# Patient Record
Sex: Female | Born: 1975 | Hispanic: Yes | Smoking: Never smoker
Health system: Southern US, Community
[De-identification: ages and names within clinical notes are randomized; demographics above are authoritative.]

## PROBLEM LIST (undated history)

## (undated) DIAGNOSIS — E785 Hyperlipidemia, unspecified: Secondary | ICD-10-CM

## (undated) DIAGNOSIS — D649 Anemia, unspecified: Secondary | ICD-10-CM

## (undated) DIAGNOSIS — R7303 Prediabetes: Secondary | ICD-10-CM

## (undated) HISTORY — DX: Hyperlipidemia, unspecified: E78.5

## (undated) HISTORY — DX: Prediabetes: R73.03

---

## 2001-10-26 HISTORY — PX: TUBAL LIGATION: SHX77

## 2015-03-07 ENCOUNTER — Ambulatory Visit (INDEPENDENT_AMBULATORY_CARE_PROVIDER_SITE_OTHER): Payer: Self-pay | Admitting: Family Medicine

## 2015-03-07 VITALS — BP 102/68 | HR 109 | Temp 99.5°F | Resp 18 | Ht 63.5 in | Wt 182.0 lb

## 2015-03-07 DIAGNOSIS — R112 Nausea with vomiting, unspecified: Secondary | ICD-10-CM

## 2015-03-07 DIAGNOSIS — R197 Diarrhea, unspecified: Secondary | ICD-10-CM

## 2015-03-07 MED ORDER — ONDANSETRON 4 MG PO TBDP: 4.0000 mg | ORAL_TABLET | Freq: Three times a day (TID) | ORAL | Status: DC | PRN

## 2015-03-07 NOTE — Patient Instructions (Signed)
Continue to drink plenty of fluids to stay hydrated. Pepto bismol or Immodium if needed.  If fever persists into tomorrow, or diarrhea is not improving tomorrow - return so we can check stool tests and possible start antibiotic. You do not need to check in for these tests (unless your symptoms are worsening).  Let them know at check in that I am expecting you and you are here for labs only. The team leader can advise me if you return tomorrow.  Zofran if needed for nausea.  Return to the clinic or go to the nearest emergency room if any of your symptoms worsen or new symptoms occur.   Gastroenteritis:  Diarrhea Infections caused by germs (bacterial) or a virus commonly cause diarrhea. Your caregiver has determined that with time, rest and fluids, the diarrhea should improve. In general, eat normally while drinking more water than usual. Although water may prevent dehydration, it does not contain salt and minerals (electrolytes). Broths, weak tea without caffeine and oral rehydration solutions (ORS) replace fluids and electrolytes. Small amounts of fluids should be taken frequently. Large amounts at one time may not be tolerated. Plain water may be harmful in infants and the elderly. Oral rehydrating solutions (ORS) are available at pharmacies and grocery stores. ORS replace water and important electrolytes in proper proportions. Sports drinks are not as effective as ORS and may be harmful due to sugars worsening diarrhea.  ORS is especially recommended for use in children with diarrhea. As a general guideline for children, replace any new fluid losses from diarrhea and/or vomiting with ORS as follows:   If your child weighs 22 pounds or under (10 kg or less), give 60-120 mL ( -  cup or 2 - 4 ounces) of ORS for each episode of diarrheal stool or vomiting episode.   If your child weighs more than 22 pounds (more than 10 kgs), give 120-240 mL ( - 1 cup or 4 - 8 ounces) of ORS for each diarrheal stool  or episode of vomiting.   While correcting for dehydration, children should eat normally. However, foods high in sugar should be avoided because this may worsen diarrhea. Large amounts of carbonated soft drinks, juice, gelatin desserts and other highly sugared drinks should be avoided.   After correction of dehydration, other liquids that are appealing to the child may be added. Children should drink small amounts of fluids frequently and fluids should be increased as tolerated. Children should drink enough fluids to keep urine clear or pale yellow.   Adults should eat normally while drinking more fluids than usual. Drink small amounts of fluids frequently and increase as tolerated. Drink enough fluids to keep urine clear or pale yellow. Broths, weak decaffeinated tea, lemon lime soft drinks (allowed to go flat) and ORS replace fluids and electrolytes.   Avoid:   Carbonated drinks.   Juice.   Extremely hot or cold fluids.   Caffeine drinks.   Fatty, greasy foods.   Alcohol.   Tobacco.   Too much intake of anything at one time.   Gelatin desserts.   Probiotics are active cultures of beneficial bacteria. They may lessen the amount and number of diarrheal stools in adults. Probiotics can be found in yogurt with active cultures and in supplements.   Wash hands well to avoid spreading bacteria and virus.   Anti-diarrheal medications are not recommended for infants and children.   Only take over-the-counter or prescription medicines for pain, discomfort or fever as directed by your caregiver. Do  not give aspirin to children because it may cause Reye's Syndrome.   For adults, ask your caregiver if you should continue all prescribed and over-the-counter medicines.   If your caregiver has given you a follow-up appointment, it is very important to keep that appointment. Not keeping the appointment could result in a chronic or permanent injury, and disability. If there is any problem  keeping the appointment, you must call back to this facility for assistance.  SEEK IMMEDIATE MEDICAL CARE IF:   You or your child is unable to keep fluids down or other symptoms or problems become worse in spite of treatment.   Vomiting or diarrhea develops and becomes persistent.   There is vomiting of blood or bile (green material).   There is blood in the stool or the stools are black and tarry.   There is no urine output in 6-8 hours or there is only a small amount of very dark urine.   Abdominal pain develops, increases or localizes.   You have a fever.   Your baby is older than 3 months with a rectal temperature of 102 F (38.9 C) or higher.   Your baby is 68 months old or younger with a rectal temperature of 100.4 F (38 C) or higher.   You or your child develops excessive weakness, dizziness, fainting or extreme thirst.   You or your child develops a rash, stiff neck, severe headache or become irritable or sleepy and difficult to awaken.  MAKE SURE YOU:   Understand these instructions.   Will watch your condition.   Will get help right away if you are not doing well or get worse.  Document Released: 10/02/2002 Document Revised: 10/01/2011 Document Reviewed: 08/19/2009 [facility] Patient Information 2012 [place].  Nausea and Vomiting Nausea is a sick feeling that often comes before throwing up (vomiting). Vomiting is a reflex where stomach contents come out of your mouth. Vomiting can cause severe loss of body fluids (dehydration). Children and elderly adults can become dehydrated quickly, especially if they also have diarrhea. Nausea and vomiting are symptoms of a condition or disease. It is important to find the cause of your symptoms. CAUSES   Direct irritation of the stomach lining. This irritation can result from increased acid production (gastroesophageal reflux disease), infection, food poisoning, taking certain medicines (such as nonsteroidal  anti-inflammatory drugs), alcohol use, or tobacco use.   Signals from the brain.These signals could be caused by a headache, heat exposure, an inner ear disturbance, increased pressure in the brain from injury, infection, a tumor, or a concussion, pain, emotional stimulus, or metabolic problems.   An obstruction in the gastrointestinal tract (bowel obstruction).   Illnesses such as diabetes, hepatitis, gallbladder problems, appendicitis, kidney problems, cancer, sepsis, atypical symptoms of a heart attack, or eating disorders.   Medical treatments such as chemotherapy and radiation.   Receiving medicine that makes you sleep (general anesthetic) during surgery.  DIAGNOSIS Your caregiver may ask for tests to be done if the problems do not improve after a few days. Tests may also be done if symptoms are severe or if the reason for the nausea and vomiting is not clear. Tests may include:  Urine tests.   Blood tests.   Stool tests.   Cultures (to look for evidence of infection).   X-rays or other imaging studies.  Test results can help your caregiver make decisions about treatment or the need for additional tests. TREATMENT You need to stay well hydrated. Drink frequently but  in small amounts.You may wish to drink water, sports drinks, clear broth, or eat frozen ice pops or gelatin dessert to help stay hydrated.When you eat, eating slowly may help prevent nausea.There are also some antinausea medicines that may help prevent nausea. HOME CARE INSTRUCTIONS   Take all medicine as directed by your caregiver.   If you do not have an appetite, do not force yourself to eat. However, you must continue to drink fluids.   If you have an appetite, eat a normal diet unless your caregiver tells you differently.   Eat a variety of complex carbohydrates (rice, wheat, potatoes, bread), lean meats, yogurt, fruits, and vegetables.   Avoid high-fat foods because they are more difficult to digest.    Drink enough water and fluids to keep your urine clear or pale yellow.   If you are dehydrated, ask your caregiver for specific rehydration instructions. Signs of dehydration may include:   Severe thirst.   Dry lips and mouth.   Dizziness.   Dark urine.   Decreasing urine frequency and amount.   Confusion.   Rapid breathing or pulse.  SEEK IMMEDIATE MEDICAL CARE IF:   You have blood or brown flecks (like coffee grounds) in your vomit.   You have black or bloody stools.   You have a severe headache or stiff neck.   You are confused.   You have severe abdominal pain.   You have chest pain or trouble breathing.   You do not urinate at least once every 8 hours.   You develop cold or clammy skin.   You continue to vomit for longer than 24 to 48 hours.   You have a fever.  MAKE SURE YOU:   Understand these instructions.   Will watch your condition.   Will get help right away if you are not doing well or get worse.  Document Released: 10/12/2005 Document Revised: 10/01/2011 Document Reviewed: 03/11/2011 [facility] Patient Information 2012 [place], [place].

## 2015-03-07 NOTE — Progress Notes (Signed)
Subjective:  This chart was scribed for Merri Ray, MD by [facility], medical scribe at [facility].The patient was seen in exam room 05 and the patient's care was started at 4:21 PM.   Patient ID: Katrina Richardson, female    DOB: [birth year], 39 y.o.   MRN: [id number] Chief Complaint  Patient presents with  . Diarrhea    x2 days   . Fever  . Headache  . Emesis   HPI  HPI Comments: Katrina Richardson is a 39 y.o. female who presents to [facility] complaining of diarrhea, fever, headache and emesis. Pt has one episode of emesis two days ago. Diarrhea began two days ago and she has had several episodes since. Her last episode was today in the office. She has a nausea, loss of appetite headache, hot and cold spells, dizziness and lightheadedness as associated symptoms. Pt is able to eat and drink, but eating upsets her stomach. She is urinating normally. Pt has taken Pepto Bismol, Advil and Kaopectate. Pt says she may have eaten either undercooked or spoiled bacon two days ago. She denies sick contacts, pt has not traveled outside the country and has not had a flu vaccine. She denies back pain and cough.  Language barrier, a translator was present.   There are no active problems to display for this patient.  History reviewed. No pertinent past medical history. Past Surgical History  Procedure Laterality Date  . Cesarean section     No Known Allergies Prior to Admission medications   Not on File   History   Social History  . Marital Status: Single    Spouse Name: N/A  . Number of Children: N/A  . Years of Education: N/A   Occupational History  . Not on file.   Social History Main Topics  . Smoking status: Never Smoker   . Smokeless tobacco: Not on file  . Alcohol Use: No  . Drug Use: No  . Sexual Activity: Not on file   Other Topics Concern  . Not on file   Social History Narrative  . No narrative on file    Review of Systems  Constitutional: Positive for chills, diaphoresis and appetite change. Negative for fever.  Gastrointestinal: Positive for nausea, vomiting, abdominal pain and diarrhea.  Musculoskeletal: Negative for back pain.  Neurological: Positive for dizziness, light-headedness and headaches.      Objective:  Physical Exam  Constitutional: She is oriented to person, place, and time. She appears well-developed and well-nourished. No distress.  HENT:  Head: Normocephalic and atraumatic.  Right Ear: Hearing, tympanic membrane, external ear and ear canal normal.  Left Ear: Hearing, tympanic membrane, external ear and ear canal normal.  Nose: Nose normal.  Mouth/Throat: Oropharynx is clear and moist. No oropharyngeal exudate.  Moist oral mucosa   Eyes: Conjunctivae and EOM are normal. Pupils are equal, round, and reactive to light.  Cardiovascular: Normal rate, regular rhythm, normal heart sounds and intact distal pulses.   No murmur heard. Pulmonary/Chest: Effort normal and breath sounds normal. No respiratory distress. She has no wheezes. She has no rhonchi.  Abdominal:  Negative Murphy's.Minimal tenderness diffusely otherwise. Hyperactive bowel sound.  Neurological: She is alert and oriented to person, place, and time.  Skin: Skin is warm and dry. No rash noted.  Normal turgor and no rash  Psychiatric: She has a normal mood and affect. Her behavior is normal.  Vitals reviewed. BP 102/68 mmHg  Pulse 109  Temp(Src) 99.5 F (37.5 C) (Oral)  Resp 18  Ht 5' 3.5" (1.613 m)  Wt 182 lb (82.555 kg)  BMI 31.73 kg/m2  SpO2 98%  LMP 03/05/2015  Orthostatic vitals reviewed - not orthostatic.     Assessment & Plan:   Katrina Richardson is a 39 y.o. female Diarrhea - Plan: ondansetron (ZOFRAN ODT) 4 MG disintegrating tablet  Non-intractable vomiting with nausea, vomiting of unspecified type - Plan: ondansetron (ZOFRAN ODT) 4 MG disintegrating tablet  Suspected viral  gastroenteritis, vs. food borne illness. Appears well hydrated, and tolerating PO fluids with normal UOP.  No known risk factors of C diff, and no recent travel.  -Continued sx care with pepto or immodium, zofran if needed for nausea, and oral rehydration.   -if diarrhea frequency persisted into next day - planned to return for lab visit only for stool culture,Cdiff and probable Cipro for 3-5 days.   -ER/RTC precautions.   Meds ordered this encounter  Medications  . ondansetron (ZOFRAN ODT) 4 MG disintegrating tablet    Sig: Take 1 tablet (4 mg total) by mouth every 8 (eight) hours as needed for nausea or vomiting.    Dispense:  5 tablet    Refill:  0   Patient Instructions  Continue to drink plenty of fluids to stay hydrated. Pepto bismol or Immodium if needed.  If fever persists into tomorrow, or diarrhea is not improving tomorrow - return so we can check stool tests and possible start antibiotic. You do not need to check in for these tests (unless your symptoms are worsening).  Let them know at check in that I am expecting you and you are here for labs only. The team leader can advise me if you return tomorrow.  Zofran if needed for nausea.  Return to the clinic or go to the nearest emergency room if any of your symptoms worsen or new symptoms occur.   Gastroenteritis:  Diarrhea Infections caused by germs (bacterial) or a virus commonly cause diarrhea. Your caregiver has determined that with time, rest and fluids, the diarrhea should improve. In general, eat normally while drinking more water than usual. Although water may prevent dehydration, it does not contain salt and minerals (electrolytes). Broths, weak tea without caffeine and oral rehydration solutions (ORS) replace fluids and electrolytes. Small amounts of fluids should be taken frequently. Large amounts at one time may not be tolerated. Plain water may be harmful in infants and the elderly. Oral rehydrating solutions (ORS) are  available at pharmacies and grocery stores. ORS replace water and important electrolytes in proper proportions. Sports drinks are not as effective as ORS and may be harmful due to sugars worsening diarrhea.  ORS is especially recommended for use in children with diarrhea. As a general guideline for children, replace any new fluid losses from diarrhea and/or vomiting with ORS as follows:   If your child weighs 22 pounds or under (10 kg or less), give 60-120 mL ( -  cup or 2 - 4 ounces) of ORS for each episode of diarrheal stool or vomiting episode.   If your child weighs more than 22 pounds (more than 10 kgs), give 120-240 mL ( - 1 cup or 4 - 8 ounces) of ORS for each diarrheal stool or episode of vomiting.   While correcting for dehydration, children should eat normally. However, foods high in sugar should be avoided because this may worsen diarrhea. Large amounts of carbonated soft drinks, juice, gelatin desserts and other highly sugared drinks should  be avoided.   After correction of dehydration, other liquids that are appealing to the child may be added. Children should drink small amounts of fluids frequently and fluids should be increased as tolerated. Children should drink enough fluids to keep urine clear or pale yellow.   Adults should eat normally while drinking more fluids than usual. Drink small amounts of fluids frequently and increase as tolerated. Drink enough fluids to keep urine clear or pale yellow. Broths, weak decaffeinated tea, lemon lime soft drinks (allowed to go flat) and ORS replace fluids and electrolytes.   Avoid:   Carbonated drinks.   Juice.   Extremely hot or cold fluids.   Caffeine drinks.   Fatty, greasy foods.   Alcohol.   Tobacco.   Too much intake of anything at one time.   Gelatin desserts.   Probiotics are active cultures of beneficial bacteria. They may lessen the amount and number of diarrheal stools in adults. Probiotics can be found in  yogurt with active cultures and in supplements.   Wash hands well to avoid spreading bacteria and virus.   Anti-diarrheal medications are not recommended for infants and children.   Only take over-the-counter or prescription medicines for pain, discomfort or fever as directed by your caregiver. Do not give aspirin to children because it may cause Reye's Syndrome.   For adults, ask your caregiver if you should continue all prescribed and over-the-counter medicines.   If your caregiver has given you a follow-up appointment, it is very important to keep that appointment. Not keeping the appointment could result in a chronic or permanent injury, and disability. If there is any problem keeping the appointment, you must call back to this facility for assistance.  SEEK IMMEDIATE MEDICAL CARE IF:   You or your child is unable to keep fluids down or other symptoms or problems become worse in spite of treatment.   Vomiting or diarrhea develops and becomes persistent.   There is vomiting of blood or bile (green material).   There is blood in the stool or the stools are black and tarry.   There is no urine output in 6-8 hours or there is only a small amount of very dark urine.   Abdominal pain develops, increases or localizes.   You have a fever.   Your baby is older than 3 months with a rectal temperature of 102 F (38.9 C) or higher.   Your baby is 37 months old or younger with a rectal temperature of 100.4 F (38 C) or higher.   You or your child develops excessive weakness, dizziness, fainting or extreme thirst.   You or your child develops a rash, stiff neck, severe headache or become irritable or sleepy and difficult to awaken.  MAKE SURE YOU:   Understand these instructions.   Will watch your condition.   Will get help right away if you are not doing well or get worse.  Document Released: 10/02/2002 Document Revised: 10/01/2011 Document Reviewed: 08/19/2009 [facility] Patient  Information 2012 [place].  Nausea and Vomiting Nausea is a sick feeling that often comes before throwing up (vomiting). Vomiting is a reflex where stomach contents come out of your mouth. Vomiting can cause severe loss of body fluids (dehydration). Children and elderly adults can become dehydrated quickly, especially if they also have diarrhea. Nausea and vomiting are symptoms of a condition or disease. It is important to find the cause of your symptoms. CAUSES   Direct irritation of the stomach lining. This irritation  can result from increased acid production (gastroesophageal reflux disease), infection, food poisoning, taking certain medicines (such as nonsteroidal anti-inflammatory drugs), alcohol use, or tobacco use.   Signals from the brain.These signals could be caused by a headache, heat exposure, an inner ear disturbance, increased pressure in the brain from injury, infection, a tumor, or a concussion, pain, emotional stimulus, or metabolic problems.   An obstruction in the gastrointestinal tract (bowel obstruction).   Illnesses such as diabetes, hepatitis, gallbladder problems, appendicitis, kidney problems, cancer, sepsis, atypical symptoms of a heart attack, or eating disorders.   Medical treatments such as chemotherapy and radiation.   Receiving medicine that makes you sleep (general anesthetic) during surgery.  DIAGNOSIS Your caregiver may ask for tests to be done if the problems do not improve after a few days. Tests may also be done if symptoms are severe or if the reason for the nausea and vomiting is not clear. Tests may include:  Urine tests.   Blood tests.   Stool tests.   Cultures (to look for evidence of infection).   X-rays or other imaging studies.  Test results can help your caregiver make decisions about treatment or the need for additional tests. TREATMENT You need to stay well hydrated. Drink frequently but in small amounts.You may wish to drink  water, sports drinks, clear broth, or eat frozen ice pops or gelatin dessert to help stay hydrated.When you eat, eating slowly may help prevent nausea.There are also some antinausea medicines that may help prevent nausea. HOME CARE INSTRUCTIONS   Take all medicine as directed by your caregiver.   If you do not have an appetite, do not force yourself to eat. However, you must continue to drink fluids.   If you have an appetite, eat a normal diet unless your caregiver tells you differently.   Eat a variety of complex carbohydrates (rice, wheat, potatoes, bread), lean meats, yogurt, fruits, and vegetables.   Avoid high-fat foods because they are more difficult to digest.   Drink enough water and fluids to keep your urine clear or pale yellow.   If you are dehydrated, ask your caregiver for specific rehydration instructions. Signs of dehydration may include:   Severe thirst.   Dry lips and mouth.   Dizziness.   Dark urine.   Decreasing urine frequency and amount.   Confusion.   Rapid breathing or pulse.  SEEK IMMEDIATE MEDICAL CARE IF:   You have blood or brown flecks (like coffee grounds) in your vomit.   You have black or bloody stools.   You have a severe headache or stiff neck.   You are confused.   You have severe abdominal pain.   You have chest pain or trouble breathing.   You do not urinate at least once every 8 hours.   You develop cold or clammy skin.   You continue to vomit for longer than 24 to 48 hours.   You have a fever.  MAKE SURE YOU:   Understand these instructions.   Will watch your condition.   Will get help right away if you are not doing well or get worse.  Document Released: 10/12/2005 Document Revised: 10/01/2011 Document Reviewed: 03/11/2011 [facility] Patient Information 2012 [place], [place].

## 2015-03-09 ENCOUNTER — Encounter (HOSPITAL_COMMUNITY): Payer: Self-pay | Admitting: Emergency Medicine

## 2015-03-09 ENCOUNTER — Emergency Department (HOSPITAL_COMMUNITY)
Admission: EM | Admit: 2015-03-09 | Discharge: 2015-03-09 | Disposition: A | Discharge status: Home / Self Care | Payer: Self-pay | Attending: Emergency Medicine | Admitting: Emergency Medicine

## 2015-03-09 DIAGNOSIS — R509 Fever, unspecified: Secondary | ICD-10-CM | POA: Insufficient documentation

## 2015-03-09 DIAGNOSIS — R51 Headache: Secondary | ICD-10-CM | POA: Insufficient documentation

## 2015-03-09 DIAGNOSIS — Z3202 Encounter for pregnancy test, result negative: Secondary | ICD-10-CM | POA: Insufficient documentation

## 2015-03-09 DIAGNOSIS — K529 Noninfective gastroenteritis and colitis, unspecified: Secondary | ICD-10-CM | POA: Insufficient documentation

## 2015-03-09 LAB — CBC WITH DIFFERENTIAL/PLATELET
BASOS ABS: 0 10*3/uL (ref 0.0–0.1)
BASOS PCT: 0 % (ref 0–1)
EOS PCT: 1 % (ref 0–5)
Eosinophils Absolute: 0.1 10*3/uL (ref 0.0–0.7)
HEMATOCRIT: 37.8 % (ref 36.0–46.0)
HEMOGLOBIN: 12.1 g/dL (ref 12.0–15.0)
Lymphocytes Relative: 27 % (ref 12–46)
Lymphs Abs: 1.9 10*3/uL (ref 0.7–4.0)
MCH: 23.9 pg — AB (ref 26.0–34.0)
MCHC: 32 g/dL (ref 30.0–36.0)
MCV: 74.6 fL — ABNORMAL LOW (ref 78.0–100.0)
MONO ABS: 0.5 10*3/uL (ref 0.1–1.0)
Monocytes Relative: 7 % (ref 3–12)
Neutro Abs: 4.4 10*3/uL (ref 1.7–7.7)
Neutrophils Relative %: 65 % (ref 43–77)
Platelets: 307 10*3/uL (ref 150–400)
RBC: 5.07 MIL/uL (ref 3.87–5.11)
RDW: 15.1 % (ref 11.5–15.5)
WBC: 6.8 10*3/uL (ref 4.0–10.5)

## 2015-03-09 LAB — URINALYSIS, ROUTINE W REFLEX MICROSCOPIC
BILIRUBIN URINE: NEGATIVE
GLUCOSE, UA: NEGATIVE mg/dL
Ketones, ur: NEGATIVE mg/dL
Leukocytes, UA: NEGATIVE
Nitrite: NEGATIVE
PH: 6 (ref 5.0–8.0)
Protein, ur: NEGATIVE mg/dL
SPECIFIC GRAVITY, URINE: 1.027 (ref 1.005–1.030)
Urobilinogen, UA: 0.2 mg/dL (ref 0.0–1.0)

## 2015-03-09 LAB — COMPREHENSIVE METABOLIC PANEL
ALK PHOS: 67 U/L (ref 38–126)
ALT: 21 U/L (ref 14–54)
AST: 27 U/L (ref 15–41)
Albumin: 3.5 g/dL (ref 3.5–5.0)
Anion gap: 8 (ref 5–15)
BUN: 11 mg/dL (ref 6–20)
CHLORIDE: 103 mmol/L (ref 101–111)
CO2: 22 mmol/L (ref 22–32)
Calcium: 8.8 mg/dL — ABNORMAL LOW (ref 8.9–10.3)
Creatinine, Ser: 0.68 mg/dL (ref 0.44–1.00)
GFR calc Af Amer: >60 mL/min (ref >60)
Glucose, Bld: 100 mg/dL — ABNORMAL HIGH (ref 65–99)
Potassium: 3.7 mmol/L (ref 3.5–5.1)
Sodium: 133 mmol/L — ABNORMAL LOW (ref 135–145)
TOTAL PROTEIN: 7.3 g/dL (ref 6.5–8.1)
Total Bilirubin: 0.3 mg/dL (ref 0.3–1.2)

## 2015-03-09 LAB — URINE MICROSCOPIC-ADD ON

## 2015-03-09 LAB — POC URINE PREG, ED: PREG TEST UR: NEGATIVE

## 2015-03-09 LAB — LIPASE, BLOOD: LIPASE: 38 U/L (ref 22–51)

## 2015-03-09 MED ORDER — LOPERAMIDE HCL 2 MG PO CAPS: 2.0000 mg | ORAL_CAPSULE | Freq: Four times a day (QID) | ORAL | Status: DC | PRN

## 2015-03-09 MED ORDER — SODIUM CHLORIDE 0.9 % IV BOLUS (SEPSIS)
1000.0000 mL | INTRAVENOUS | Status: AC
  Administered 2015-03-09: 1000 mL via INTRAVENOUS

## 2015-03-09 NOTE — ED Notes (Addendum)
Pt c/o abdominal pain with vomiting and diarrhea x 4 days. Reports symptoms occur every time she eats. Pt seen by a Dr 2 days ago for same but was not given any medications

## 2015-03-09 NOTE — ED Provider Notes (Signed)
CSN: [id number]     Arrival date & time 03/09/15  7673 History   First MD Initiated Contact with Patient 03/09/15 [phone]     Chief Complaint  Patient presents with  . Abdominal Pain  . Emesis  . Diarrhea     (Consider location/radiation/quality/duration/timing/severity/associated sxs/prior Treatment) Patient is a 39 y.o. female presenting with abdominal pain, vomiting, and diarrhea. The history is provided by the patient.  Abdominal Pain Pain location:  Generalized Pain quality: cramping   Pain radiates to:  Does not radiate Pain severity:  Mild Onset quality:  Sudden Duration:  3 days Timing:  Intermittent Progression:  Partially resolved Chronicity:  New Context comment:  Suspicious food Relieved by:  Nothing Worsened by:  Nothing tried Ineffective treatments:  None tried Associated symptoms: chills, diarrhea, fever, nausea and vomiting   Associated symptoms: no chest pain, no cough, no dysuria, no fatigue, no hematuria and no shortness of breath   Diarrhea:    Quality:  Watery Vomiting:    Quality:  Stomach contents Emesis Associated symptoms: abdominal pain, chills, diarrhea and headaches   Diarrhea Associated symptoms: abdominal pain, chills, fever, headaches and vomiting     History reviewed. No pertinent past medical history. Past Surgical History  Procedure Laterality Date  . Cesarean section     No family history on file. History  Substance Use Topics  . Smoking status: Never Smoker   . Smokeless tobacco: Not on file  . Alcohol Use: No   OB History    No data available     Review of Systems  Constitutional: Positive for fever and chills. Negative for fatigue.  HENT: Negative for congestion and drooling.   Eyes: Negative for pain.  Respiratory: Negative for cough and shortness of breath.   Cardiovascular: Negative for chest pain.  Gastrointestinal: Positive for nausea, vomiting, abdominal pain and diarrhea.  Genitourinary: Negative for dysuria and  hematuria.  Musculoskeletal: Negative for back pain, gait problem and neck pain.  Skin: Negative for color change.  Neurological: Positive for headaches. Negative for dizziness.  Hematological: Negative for adenopathy.  Psychiatric/Behavioral: Negative for behavioral problems.  All other systems reviewed and are negative.     Allergies  Review of patient's allergies indicates no known allergies.  Home Medications   Prior to Admission medications   Medication Sig Start Date End Date Taking? Authorizing Provider  ibuprofen (ADVIL,MOTRIN) 200 MG tablet Take 400 mg by mouth every 6 (six) hours as needed for mild pain or moderate pain.   Yes Historical Provider, MD  ondansetron (ZOFRAN ODT) 4 MG disintegrating tablet Take 1 tablet (4 mg total) by mouth every 8 (eight) hours as needed for nausea or vomiting. 03/07/15  Yes Wendie Agreste, MD   BP 116/74 mmHg  Pulse 83  Temp(Src) 98.1 F (36.7 C)  Resp 18  Ht 5\' 6"  (1.676 m)  Wt 180 lb (81.647 kg)  BMI 29.07 kg/m2  SpO2 98%  LMP 03/05/2015 Physical Exam  Constitutional: She is oriented to person, place, and time. She appears well-developed and well-nourished.  HENT:  Head: Normocephalic.  Mouth/Throat: Oropharynx is clear and moist. No oropharyngeal exudate.  Eyes: Conjunctivae and EOM are normal. Pupils are equal, round, and reactive to light.  Neck: Normal range of motion. Neck supple.  Cardiovascular: Normal rate, regular rhythm, normal heart sounds and intact distal pulses.  Exam reveals no gallop and no friction rub.   No murmur heard. Pulmonary/Chest: Effort normal and breath sounds normal. No respiratory distress. She has  no wheezes.  Abdominal: Soft. Bowel sounds are normal. There is no tenderness. There is no rebound and no guarding.  Musculoskeletal: Normal range of motion. She exhibits no edema or tenderness.  Neurological: She is alert and oriented to person, place, and time.  Skin: Skin is warm and dry.   Psychiatric: She has a normal mood and affect. Her behavior is normal.  Nursing note and vitals reviewed.   ED Course  Procedures (including critical care time) Labs Review Labs Reviewed  CBC WITH DIFFERENTIAL/PLATELET - Abnormal; Notable for the following:    MCV 74.6 (*)    MCH 23.9 (*)    All other components within normal limits  COMPREHENSIVE METABOLIC PANEL - Abnormal; Notable for the following:    Sodium 133 (*)    Glucose, Bld 100 (*)    Calcium 8.8 (*)    All other components within normal limits  URINALYSIS, ROUTINE W REFLEX MICROSCOPIC - Abnormal; Notable for the following:    Hgb urine dipstick LARGE (*)    All other components within normal limits  URINE MICROSCOPIC-ADD ON - Abnormal; Notable for the following:    Squamous Epithelial / LPF MANY (*)    Bacteria, UA FEW (*)    Crystals CA OXALATE CRYSTALS (*)    All other components within normal limits  LIPASE, BLOOD  POC URINE PREG, ED    Imaging Review No results found.   EKG Interpretation None      MDM   Final diagnoses:  Gastroenteritis    10:38 AM 39 y.o. female who presents with multiple complaints which began 3 days ago. She has had nausea, vomiting, diarrhea, abdominal pain, headache, fevers, chills. She notes that the vomiting, headache, subjective fever and chills have subsided. She was seen 2 days ago in urgent care and diagnosed with gastroenteritis. Symptomatic treatment was recommended. She states that she has had ongoing nonbloody diarrhea. She also has some abdominal pain when eating or drinking but denies any abdominal pain on exam. Her pain is diffuse and does not localize. Vital signs unremarkable here and she is well-appearing on exam. Abdomen is soft and benign. Will treat symptomatically with IV fluids and get screening lab work. I do suspect this is a viral gastroenteritis. Her symptoms started after eating a sandwich at [employer]. No abx recently, no foreign travel.   12:44 PM: I  interpreted/reviewed the labs and/or imaging which were non-contributory.  Pt cont to appear well. Will rec immodium. I have discussed the diagnosis/risks/treatment options with the patient and believe the pt to be eligible for discharge home to follow-up with her pcp or wellness center as needed. We also discussed returning to the ED immediately if new or worsening sx occur. We discussed the sx which are most concerning (e.g., bloody stools, fever, worsening abd pain) that necessitate immediate return. Medications administered to the patient during their visit and any new prescriptions provided to the patient are listed below.  Medications given during this visit Medications  sodium chloride 0.9 % bolus 1,000 mL (0 mLs Intravenous Stopped 03/09/15 1212)    New Prescriptions   LOPERAMIDE (IMODIUM) 2 MG CAPSULE    Take 1 capsule (2 mg total) by mouth 4 (four) times daily as needed for diarrhea or loose stools.     Pamella Pert, MD 03/09/15 1246

## 2016-03-13 LAB — GLUCOSE, POCT (MANUAL RESULT ENTRY): POC GLUCOSE: 81 mg/dL (ref 70–99)

## 2016-05-14 ENCOUNTER — Ambulatory Visit: Payer: Self-pay | Admitting: Internal Medicine

## 2016-05-15 ENCOUNTER — Encounter: Payer: Self-pay | Admitting: Internal Medicine

## 2016-05-15 ENCOUNTER — Ambulatory Visit (INDEPENDENT_AMBULATORY_CARE_PROVIDER_SITE_OTHER): Payer: Self-pay | Admitting: Internal Medicine

## 2016-05-15 VITALS — BP 120/70 | HR 76 | Temp 98.5°F | Resp 16 | Ht 62.25 in | Wt 187.0 lb

## 2016-05-15 DIAGNOSIS — K0889 Other specified disorders of teeth and supporting structures: Secondary | ICD-10-CM

## 2016-05-15 NOTE — Progress Notes (Signed)
   Subjective:    Patient ID: Katrina Richardson, female    DOB: 07/13/1976, 40 y.o.   MRN: [id number]  HPI   Here to establish as a new patient.  1.  Left dental pain:  Filling came out about 3 weeks ago in a left lower molar.  Hurts to eat.  2.  Wanting to get set up with a complete physical with pap.  No pap for 3 years.  Always normal in past.    Current outpatient prescriptions:  .  ibuprofen (ADVIL,MOTRIN) 200 MG tablet, Take 400 mg by mouth every 6 (six) hours as needed for mild pain or moderate pain. Reported on 05/15/2016, Disp: , Rfl:    No Known Allergies   No past medical history on file.   Past Surgical History  Procedure Laterality Date  . Cesarean section  1996, 1997, 2003  . Tubal ligation  2003    No family history on file.   Social History   Social History  . Marital Status: Single    Spouse Name: N/A  . Number of Children: 3  . Years of Education: 6   Occupational History  . 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 Smoking status: Never Smoker   . Smokeless tobacco: Never Used  . Alcohol Use: No  . Drug Use: No  . Sexual Activity:    Partners: Male    Birth Control/ Protection: Surgical     Comment: Tubal ligation 13 years ago in [place]   Other Topics Concern  . Not on file   Social History Narrative   Originally from [place]   Came to [place] In 2007   Lives with her 2 younger children   Long term boyfriend that does live with her--not father of her children.     Review of Systems     Objective:   Physical Exam   HEENT:  PERRL, EOMI, TMs pearly gray, throat without injection.  Diffuse dental disease with fillings in almost every tooth.  Missing a small portion of filling in left posterior molar Neck:  Supple, No adenopathy Chest:  CTA CV:  RRR with normal S1 and S2, NO S3, S4, or murmur, radial and DP pulses normal and equal. LE: No edema.        Assessment & Plan:  1.  Dental pain:  With loss  of filling.  Dental referral  2.  HM:  Not in [facility] for Tdap and likely unable to get her records from [facility] as they do not have records.  Will see whether we can get those before her CPE if they exist.

## 2016-08-20 ENCOUNTER — Ambulatory Visit (INDEPENDENT_AMBULATORY_CARE_PROVIDER_SITE_OTHER): Payer: Self-pay | Admitting: Internal Medicine

## 2016-08-20 ENCOUNTER — Encounter: Payer: Self-pay | Admitting: Internal Medicine

## 2016-08-20 VITALS — BP 120/78 | HR 80 | Resp 18 | Ht 62.25 in | Wt 186.0 lb

## 2016-08-20 DIAGNOSIS — R739 Hyperglycemia, unspecified: Secondary | ICD-10-CM

## 2016-08-20 DIAGNOSIS — Z1239 Encounter for other screening for malignant neoplasm of breast: Secondary | ICD-10-CM

## 2016-08-20 DIAGNOSIS — Z124 Encounter for screening for malignant neoplasm of cervix: Secondary | ICD-10-CM

## 2016-08-20 DIAGNOSIS — Z1231 Encounter for screening mammogram for malignant neoplasm of breast: Secondary | ICD-10-CM

## 2016-08-20 DIAGNOSIS — H547 Unspecified visual loss: Secondary | ICD-10-CM

## 2016-08-20 DIAGNOSIS — Z23 Encounter for immunization: Secondary | ICD-10-CM

## 2016-08-20 DIAGNOSIS — N898 Other specified noninflammatory disorders of vagina: Secondary | ICD-10-CM

## 2016-08-20 DIAGNOSIS — Z13220 Encounter for screening for lipoid disorders: Secondary | ICD-10-CM

## 2016-08-20 DIAGNOSIS — Z0000 Encounter for general adult medical examination without abnormal findings: Secondary | ICD-10-CM

## 2016-08-20 DIAGNOSIS — Z1211 Encounter for screening for malignant neoplasm of colon: Secondary | ICD-10-CM

## 2016-08-20 DIAGNOSIS — D649 Anemia, unspecified: Secondary | ICD-10-CM

## 2016-08-20 LAB — POCT WET PREP WITH KOH
KOH PREP POC: NEGATIVE
RBC Wet Prep HPF POC: NEGATIVE
TRICHOMONAS UA: NEGATIVE
YEAST WET PREP PER HPF POC: NEGATIVE

## 2016-08-20 MED ORDER — CALCIUM-MAGNESIUM-VITAMIN D ER 600-40-500 MG-MG-UNIT PO TB24: ORAL_TABLET | ORAL | Status: AC

## 2016-08-20 NOTE — Progress Notes (Signed)
Subjective:    Patient ID: Katrina Richardson, female    DOB: [birth year], 40 y.o.   MRN: [id number]  HPI  CPE with pap  1.  Pap:  Last pap was 4 years ago.  Always normal in past.  2.  Mammogram:  No mammogram in past.  Is filling out scholarship  3.  Osteoprevention:  No milk or yogurt, occasional cheese.  Rare physical activity.  Sometimes goes for a walk of does Zumba.  4.  Guaiac Cards:  Never  5.  Colonoscopy:  Never  6.  Immunizations: There is no immunization history on file for this patient.  Current Meds  Medication Sig  . ibuprofen (ADVIL,MOTRIN) 200 MG tablet Take 400 mg by mouth every 6 (six) hours as needed for mild pain or moderate pain. Reported on 05/15/2016    No Known Allergies  Review of Systems  Constitutional: Negative for fatigue and fever.  HENT: Negative for dental problem, ear pain, hearing loss, sinus pressure and sore throat.        Has dental appt. Coming up  Eyes: Positive for visual disturbance. Negative for itching.       Blurry vision at times. Not clear when she has the problem.  Respiratory: Negative for cough and shortness of breath.   Cardiovascular: Negative for chest pain, palpitations and leg swelling.  Gastrointestinal: Negative for abdominal pain, blood in stool, constipation, diarrhea and nausea.       No melena  Genitourinary: Positive for frequency. Negative for dysuria, hematuria, menstrual problem and vaginal discharge.       Not clear if thirsty all the time.  Does drink lots of water and then urinates a lot.    Musculoskeletal: Negative for arthralgias.  Skin:       For 4 months, has had itching under right axilla and breast with hyperpigmented skin subsequently--may have been hyperpigmented from the beginning.  Neurological:       Sometimes hands get numbness and tingling at night when sleeps.  Occurs 2-3 times per month  Psychiatric/Behavioral: Negative for dysphoric mood and suicidal ideas. The patient is not  nervous/anxious.        Objective:   Physical Exam  Constitutional: She is oriented to person, place, and time. She appears well-developed and well-nourished.  obese  HENT:  Head: Normocephalic and atraumatic.  Right Ear: Hearing, tympanic membrane, external ear and ear canal normal.  Left Ear: Hearing, tympanic membrane, external ear and ear canal normal.  Nose: Nose normal.  Mouth/Throat: Uvula is midline, oropharynx is clear and moist and mucous membranes are normal. Dental caries present.  Eyes: Conjunctivae and EOM are normal. Pupils are equal, round, and reactive to light.  Discs sharp bilaterally  Neck: Normal range of motion. Neck supple. No thyroid mass and no thyromegaly present.  Cardiovascular: Normal rate, regular rhythm, S1 normal, S2 normal and intact distal pulses.  Exam reveals no S3, no S4 and no friction rub.   No murmur heard. No carotid bruits.  Carotid, radial, femoral, DP and PT pulses normal and equal.   Pulmonary/Chest: Effort normal and breath sounds normal. Right breast exhibits no inverted nipple, no mass, no nipple discharge, no skin change and no tenderness. Left breast exhibits no inverted nipple, no mass, no nipple discharge, no skin change and no tenderness.  Abdominal: Soft. Bowel sounds are normal. She exhibits no mass. There is no hepatosplenomegaly. There is no tenderness. No hernia.  Genitourinary: Vagina normal and uterus normal. Rectal exam shows no  mass and guaiac negative stool. Cervix exhibits no motion tenderness. Right adnexum displays no mass and no tenderness. Left adnexum displays no mass and no tenderness.  Genitourinary Comments: Scant white vaginal discharge.  No odor  Musculoskeletal: Normal range of motion.  Lymphadenopathy:       Head (right side): No submental and no submandibular adenopathy present.       Head (left side): No submental and no submandibular adenopathy present.    She has no cervical adenopathy.    She has no axillary  adenopathy.       Right: No inguinal and no supraclavicular adenopathy present.       Left: No inguinal and no supraclavicular adenopathy present.  Neurological: She is alert and oriented to person, place, and time. She has normal strength and normal reflexes. No cranial nerve deficit or sensory deficit. Coordination and gait normal.  Skin: Skin is warm and dry.  Spotty hyperpigmentation right axilla, right side of chest and under right breast to sternum.  No acute inflammation    Psychiatric: She has a normal mood and affect. Her speech is normal and behavior is normal. Judgment and thought content normal. Cognition and memory are normal.        Assessment & Plan:  1.  CPE with pap Wet prep and KOH shows mild BV, but patient asymptomatic, so will not treat. Mammogram ordered with scholarship Tdap vaccine Influenza vaccine Guaiac Cards x 3 given, to return in 2 weeks. CBC, CMP. FLP  2.  Decreased visual acuity:  Optometry referral  3.  Dental decay:  Has upcoming dental appt.

## 2016-08-21 LAB — COMPREHENSIVE METABOLIC PANEL
ALK PHOS: 84 IU/L (ref 39–117)
ALT: 22 IU/L (ref 0–32)
AST: 24 IU/L (ref 0–40)
Albumin/Globulin Ratio: 1.3 (ref 1.2–2.2)
Albumin: 4.4 g/dL (ref 3.5–5.5)
BILIRUBIN TOTAL: 0.2 mg/dL (ref 0.0–1.2)
BUN/Creatinine Ratio: 21 (ref 9–23)
BUN: 13 mg/dL (ref 6–24)
CO2: 23 mmol/L (ref 18–29)
Calcium: 9.1 mg/dL (ref 8.7–10.2)
Chloride: 100 mmol/L (ref 96–106)
Creatinine, Ser: 0.61 mg/dL (ref 0.57–1.00)
GFR calc Af Amer: 131 mL/min/{1.73_m2} (ref >59)
GFR calc non Af Amer: 114 mL/min/{1.73_m2} (ref >59)
GLUCOSE: 105 mg/dL — AB (ref 65–99)
Globulin, Total: 3.5 g/dL (ref 1.5–4.5)
POTASSIUM: 4.9 mmol/L (ref 3.5–5.2)
Sodium: 137 mmol/L (ref 134–144)
Total Protein: 7.9 g/dL (ref 6.0–8.5)

## 2016-08-21 LAB — CBC WITH DIFFERENTIAL/PLATELET
BASOS ABS: 0 10*3/uL (ref 0.0–0.2)
Basos: 1 %
EOS (ABSOLUTE): 0.1 10*3/uL (ref 0.0–0.4)
EOS: 1 %
HEMATOCRIT: 32 % — AB (ref 34.0–46.6)
Hemoglobin: 9.6 g/dL — ABNORMAL LOW (ref 11.1–15.9)
IMMATURE GRANULOCYTES: 0 %
Immature Grans (Abs): 0 10*3/uL (ref 0.0–0.1)
LYMPHS ABS: 2.7 10*3/uL (ref 0.7–3.1)
Lymphs: 34 %
MCH: 19.8 pg — ABNORMAL LOW (ref 26.6–33.0)
MCHC: 30 g/dL — AB (ref 31.5–35.7)
MCV: 66 fL — ABNORMAL LOW (ref 79–97)
Monocytes Absolute: 0.5 10*3/uL (ref 0.1–0.9)
Monocytes: 6 %
NEUTROS PCT: 58 %
Neutrophils Absolute: 4.6 10*3/uL (ref 1.4–7.0)
Platelets: 425 10*3/uL — ABNORMAL HIGH (ref 150–379)
RBC: 4.84 x10E6/uL (ref 3.77–5.28)
RDW: 18.6 % — AB (ref 12.3–15.4)
WBC: 8 10*3/uL (ref 3.4–10.8)

## 2016-08-21 LAB — LIPID PANEL W/O CHOL/HDL RATIO
Cholesterol, Total: 143 mg/dL (ref 100–199)
HDL: 37 mg/dL — ABNORMAL LOW (ref >39)
LDL CALC: 87 mg/dL (ref 0–99)
TRIGLYCERIDES: 96 mg/dL (ref 0–149)
VLDL Cholesterol Cal: 19 mg/dL (ref 5–40)

## 2016-08-24 LAB — CYTOLOGY - PAP

## 2016-08-25 ENCOUNTER — Ambulatory Visit
Admission: RE | Admit: 2016-08-25 | Discharge: 2016-08-25 | Disposition: A | Discharge status: Home / Self Care | Payer: No Typology Code available for payment source | Source: Ambulatory Visit | Attending: Internal Medicine | Admitting: Internal Medicine

## 2016-08-25 DIAGNOSIS — Z1239 Encounter for other screening for malignant neoplasm of breast: Secondary | ICD-10-CM

## 2016-08-25 LAB — IRON AND TIBC
Iron Saturation: 5 % — CL (ref 15–55)
Iron: 20 ug/dL — ABNORMAL LOW (ref 27–159)
Total Iron Binding Capacity: 406 ug/dL (ref 250–450)
UIBC: 386 ug/dL (ref 131–425)

## 2016-08-25 LAB — HGB A1C W/O EAG: HEMOGLOBIN A1C: 6 % — AB (ref 4.8–5.6)

## 2016-08-25 LAB — SPECIMEN STATUS REPORT

## 2016-08-27 ENCOUNTER — Other Ambulatory Visit: Payer: Self-pay | Admitting: Internal Medicine

## 2016-08-27 DIAGNOSIS — R928 Other abnormal and inconclusive findings on diagnostic imaging of breast: Secondary | ICD-10-CM

## 2016-08-28 LAB — POC HEMOCCULT BLD/STL (HOME/3-CARD/SCREEN)
Card #2 Fecal Occult Blod, POC: NEGATIVE
FECAL OCCULT BLD: NEGATIVE
Fecal Occult Blood, POC: NEGATIVE

## 2016-08-28 NOTE — Addendum Note (Signed)
Addended by: Marcelino Duster on: 08/28/2016 08:34 PM   Modules accepted: Orders

## 2016-08-31 ENCOUNTER — Other Ambulatory Visit: Payer: Self-pay | Admitting: Internal Medicine

## 2016-08-31 DIAGNOSIS — R928 Other abnormal and inconclusive findings on diagnostic imaging of breast: Secondary | ICD-10-CM

## 2016-09-23 ENCOUNTER — Other Ambulatory Visit (HOSPITAL_COMMUNITY): Payer: Self-pay | Admitting: *Deleted

## 2016-09-23 DIAGNOSIS — R928 Other abnormal and inconclusive findings on diagnostic imaging of breast: Secondary | ICD-10-CM

## 2016-09-24 ENCOUNTER — Ambulatory Visit (HOSPITAL_COMMUNITY)
Admission: RE | Admit: 2016-09-24 | Discharge: 2016-09-24 | Disposition: A | Discharge status: Home / Self Care | Payer: Self-pay | Source: Ambulatory Visit | Attending: Obstetrics and Gynecology | Admitting: Obstetrics and Gynecology

## 2016-09-24 ENCOUNTER — Ambulatory Visit
Admission: RE | Admit: 2016-09-24 | Discharge: 2016-09-24 | Disposition: A | Discharge status: Home / Self Care | Payer: No Typology Code available for payment source | Source: Ambulatory Visit | Attending: Obstetrics and Gynecology | Admitting: Obstetrics and Gynecology

## 2016-09-24 ENCOUNTER — Encounter (HOSPITAL_COMMUNITY): Payer: Self-pay

## 2016-09-24 VITALS — BP 120/84 | Temp 99.2°F | Ht 63.5 in | Wt 189.2 lb

## 2016-09-24 DIAGNOSIS — R928 Other abnormal and inconclusive findings on diagnostic imaging of breast: Secondary | ICD-10-CM

## 2016-09-24 DIAGNOSIS — Z1239 Encounter for other screening for malignant neoplasm of breast: Secondary | ICD-10-CM

## 2016-09-24 HISTORY — DX: Anemia, unspecified: D64.9

## 2016-09-24 NOTE — Patient Instructions (Signed)
Explained breast self awareness to [facility]. Patient did not need a Pap smear today due to last Pap smear was 08/20/2016. Let her know [facility] will cover Pap smears every 3 years unless has a history of abnormal Pap smears. Referred patient to the [facility] for right breast diagnostic mammogram and possible breast ultrasound per recommendation. Appointment scheduled for Thursday, September 24, 2016 at [facility]. Katrina Richardson verbalized understanding.  [name], Arvil Chaco, RN 11:15 AM

## 2016-09-24 NOTE — Progress Notes (Addendum)
Patient referred to [facility] by the [facility] due to needing additional imaging of her right breast. Screening mammogram completed 08/25/2016.  Pap Smear:  Pap smear not completed today. Last Pap smear was 08/20/2016 at [facility] and normal. Per patient has no history of an abnormal Pap smear. Last Pap smear result is in EPIC.  Physical exam: Breasts Breasts symmetrical. No skin abnormalities left breast. Discoloration of skin observed on right axillary area and breast. No nipple retraction bilateral breasts. No nipple discharge bilateral breasts. No lymphadenopathy. No lumps palpated bilateral breasts. No complaints of pain or tenderness on exam. Referred patient to the [facility] for right breast diagnostic mammogram and possible breast ultrasound per recommendation. Appointment scheduled for Thursday, September 24, 2016 at [place].        Pelvic/Bimanual No Pap smear completed today since last Pap smear was 08/20/2016. Pap smear not indicated per B[facility]CP guidelines.   Smoking History: Patient has never smoked.  Patient Navigation: Patient education provided. Access to services provided for patient through [facility] program. Spanish interpreter provided.  Used Spanish interpreter [employer] from [place].

## 2016-09-24 NOTE — Addendum Note (Signed)
Encounter addended by: Loletta Parish, RN on: 09/24/2016  2:53 PM<BR>    Actions taken: Sign clinical note

## 2016-09-25 ENCOUNTER — Encounter (HOSPITAL_COMMUNITY): Payer: Self-pay | Admitting: *Deleted

## 2016-10-14 ENCOUNTER — Encounter: Payer: Self-pay | Admitting: Internal Medicine

## 2016-10-14 ENCOUNTER — Ambulatory Visit (INDEPENDENT_AMBULATORY_CARE_PROVIDER_SITE_OTHER): Payer: Self-pay | Admitting: Internal Medicine

## 2016-10-14 VITALS — BP 112/74 | HR 74 | Resp 12 | Ht 62.0 in | Wt 187.0 lb

## 2016-10-14 DIAGNOSIS — R739 Hyperglycemia, unspecified: Secondary | ICD-10-CM

## 2016-10-14 DIAGNOSIS — R7303 Prediabetes: Secondary | ICD-10-CM | POA: Insufficient documentation

## 2016-10-14 DIAGNOSIS — D509 Iron deficiency anemia, unspecified: Secondary | ICD-10-CM

## 2016-10-14 DIAGNOSIS — E785 Hyperlipidemia, unspecified: Secondary | ICD-10-CM

## 2016-10-14 HISTORY — DX: Prediabetes: R73.03

## 2016-10-14 LAB — GLUCOSE, POCT (MANUAL RESULT ENTRY): POC GLUCOSE: 131 mg/dL — AB (ref 70–99)

## 2016-10-14 NOTE — Patient Instructions (Signed)

## 2016-10-14 NOTE — Progress Notes (Signed)
   Subjective:    Patient ID: Katrina Richardson, female    DOB: 1976-06-15, 40 y.o.   MRN: [id number]  HPI   1.  Prediabetes:  A1C in October with CPE was 6.0%.  Discussed she is in prediabetic range. Has 3 children.  She does not feel any of them are overweight. Since we called about her sugar, she has cut sugar from her diet and is using honey to sweeten instead.   Discussed increased fat weight increases risk of insulin resistance and so DM as well.      2.  Low HDL:  Discussed diet with good fat intake and physical activity  3.  Iron Deficiency Anemia:  No melena or hematochezia, denies heavy periods.  Sounds like did not have a good diet.  Taking Iron daily.  Does feel she has more energy.  Current Meds  Medication Sig  . Calcium-Magnesium-Vitamin D 600-40-500 MG-MG-UNIT TB24 1 tab by mouth twice daily  . ferrous sulfate 325 (65 FE) MG tablet Take 325 mg by mouth daily with breakfast.  . ibuprofen (ADVIL,MOTRIN) 200 MG tablet Take 400 mg by mouth every 6 (six) hours as needed for mild pain or moderate pain. Reported on 05/15/2016   No Known Allergies  Review of Systems     Objective:   Physical Exam NAD Palpebral conjunctivae perhaps a bit pale, palms with good coloration. Lungs:  CTA CV:  RRR without murmur or rub, radial pulses normal and equal       Assessment & Plan:  1.  Prediabetes:  To work on lifestyle changes with entire family.  Recheck A1C in 3 months and follow up with me thereafter  2.  Dyslipidemia:  As above.  3.  Iron Deficiency anemia:  Describes a relatively poor diet previously.  Check CBC, continue iron for now.

## 2016-10-15 LAB — CBC WITH DIFFERENTIAL/PLATELET
Basophils Absolute: 0 10*3/uL (ref 0.0–0.2)
Basos: 0 %
EOS (ABSOLUTE): 0.1 10*3/uL (ref 0.0–0.4)
Eos: 1 %
HEMATOCRIT: 37.2 % (ref 34.0–46.6)
HEMOGLOBIN: 12 g/dL (ref 11.1–15.9)
Immature Grans (Abs): 0 10*3/uL (ref 0.0–0.1)
Immature Granulocytes: 0 %
LYMPHS ABS: 2.3 10*3/uL (ref 0.7–3.1)
Lymphs: 31 %
MCH: 23.7 pg — ABNORMAL LOW (ref 26.6–33.0)
MCHC: 32.3 g/dL (ref 31.5–35.7)
MCV: 73 fL — AB (ref 79–97)
MONOS ABS: 0.6 10*3/uL (ref 0.1–0.9)
Monocytes: 7 %
NEUTROS ABS: 4.5 10*3/uL (ref 1.4–7.0)
Neutrophils: 61 %
Platelets: 361 10*3/uL (ref 150–379)
RBC: 5.07 x10E6/uL (ref 3.77–5.28)
RDW: 24 % — ABNORMAL HIGH (ref 12.3–15.4)
WBC: 7.4 10*3/uL (ref 3.4–10.8)

## 2016-10-28 NOTE — Progress Notes (Signed)
Patient was informed of her test results and stated that she has had an optometrist appointment and was told by the eye doctor to ask Dr. Amil Amen where she could go to get her eyewear prescription filled for cheap. We will contract Stormy Fabian to possibly get $69 voucher from [facility].

## 2016-10-30 NOTE — Progress Notes (Signed)
Katrina Richardson called patient and read her the lab results were understood by patient. She was reminded of upcoming appointments.

## 2017-01-12 ENCOUNTER — Other Ambulatory Visit (INDEPENDENT_AMBULATORY_CARE_PROVIDER_SITE_OTHER): Payer: Self-pay

## 2017-01-12 DIAGNOSIS — R7303 Prediabetes: Secondary | ICD-10-CM

## 2017-01-13 LAB — HGB A1C W/O EAG: Hgb A1c MFr Bld: 5.8 % — ABNORMAL HIGH (ref 4.8–5.6)

## 2017-01-14 ENCOUNTER — Ambulatory Visit (INDEPENDENT_AMBULATORY_CARE_PROVIDER_SITE_OTHER): Payer: Self-pay | Admitting: Internal Medicine

## 2017-01-14 ENCOUNTER — Encounter: Payer: Self-pay | Admitting: Internal Medicine

## 2017-01-14 VITALS — BP 128/82 | HR 84 | Resp 12 | Ht 62.5 in | Wt 178.0 lb

## 2017-01-14 DIAGNOSIS — D509 Iron deficiency anemia, unspecified: Secondary | ICD-10-CM

## 2017-01-14 DIAGNOSIS — R7303 Prediabetes: Secondary | ICD-10-CM

## 2017-01-14 NOTE — Progress Notes (Signed)
   Subjective:    Patient ID: Katrina Richardson, female    DOB: Apr 22, 1976, 41 y.o.   MRN: [id number]  HPI   1.  Prediabetes:  Patient has been more physically active since diagnosed with prediabetes 4 months earlier.  Is going to a club and walking a lot.  Doing this even with inclement weather.   Is drinking lots of water and eating more fruits.  Eating 0-3 servings of vegetables daily.  Drinking a smoothie really with 3 servings of vegetables daily in the morning.  She is not certain she will be able to stick with this long term.  Not always eating something with good fat or protein with this drink.  Discussed 2 % [place] Yogurt and/or avocados to improve good fat and protein intake.   2.  Anemia:  Essentially resolved in December with iron replacement.  Stool cards were negative for blood.  3.  History of dyslipidemia:  Has made lifestyle changes as above.  Current Meds  Medication Sig  . Calcium-Magnesium-Vitamin D 600-40-500 MG-MG-UNIT TB24 1 tab by mouth twice daily  . ferrous sulfate 325 (65 FE) MG tablet Take 325 mg by mouth daily with breakfast.  . ibuprofen (ADVIL,MOTRIN) 200 MG tablet Take 400 mg by mouth every 6 (six) hours as needed for mild pain or moderate pain. Reported on 05/15/2016   No Known Allergies  Review of Systems     Objective:   Physical Exam NAD Lungs:  CTA CV:  RRR without murmur or rub, radial pulses normal and equal Abd:  S, NT, No HSM or mass, + BS LE:  No edema       Assessment & Plan:  1.  Prediabetes:  Has made excellent lifestyle changes.  Down 9 lbs and A1C down .2 points.  Just cautioned that she do things she feels she can maintain with time. Encouraged continued regular physical activity.  2.  Dyslipidemia:  Did not check today--will check before next follow up in 4 months  3.  Iron Deficiency Anemia:  Resolved on iron replacement.  Stool cards were negative for blood.  Eating a more iron rich diet now as well.  Still  menstruating.  To continue iron replacement until follow up in 4 months and recheck CBC then as well.

## 2017-01-14 NOTE — Patient Instructions (Addendum)
Smucker's Natural Peanut Butter. Avocados have good fat also--mix that in your morning drink for more fat Also 2% [place] Yogurt would be good to mix in your morning drink.  Tome un vaso de agua antes de cada comida Tome un minimo de 6 a 8 vasos de agua diarios Coma tres veces al dia Coma una proteina y Ardelia Mems grasa saludable con comida.  (huevos, pescado, pollo, pavo, y limite carnes rojas Coma 5 porciones diarias de legumbres.  Mezcle los colores Coma 2 porciones diarias de frutas con cascara cuando sea comestible Use platos pequeos Suelte su tenedor o cuchara despues de cada mordida hata que se mastique y se trague Come en la mesa con amigos o familiares por lo menos una vez al dia Apague la televisin y aparatos electrnicos durante la comida  Su objetivo debe ser perder Ardelia Mems libra por semana

## 2017-05-04 ENCOUNTER — Other Ambulatory Visit (INDEPENDENT_AMBULATORY_CARE_PROVIDER_SITE_OTHER): Payer: Self-pay

## 2017-05-04 ENCOUNTER — Other Ambulatory Visit: Payer: Self-pay

## 2017-05-04 DIAGNOSIS — R7303 Prediabetes: Secondary | ICD-10-CM

## 2017-05-04 DIAGNOSIS — E785 Hyperlipidemia, unspecified: Secondary | ICD-10-CM

## 2017-05-04 DIAGNOSIS — D649 Anemia, unspecified: Secondary | ICD-10-CM

## 2017-05-05 LAB — CBC WITH DIFFERENTIAL/PLATELET
Basophils Absolute: 0 10*3/uL (ref 0.0–0.2)
Basos: 0 %
EOS (ABSOLUTE): 0.1 10*3/uL (ref 0.0–0.4)
Eos: 1 %
Hematocrit: 38.8 % (ref 34.0–46.6)
Hemoglobin: 12.7 g/dL (ref 11.1–15.9)
IMMATURE GRANS (ABS): 0 10*3/uL (ref 0.0–0.1)
Immature Granulocytes: 0 %
Lymphocytes Absolute: 2.4 10*3/uL (ref 0.7–3.1)
Lymphs: 35 %
MCH: 26.6 pg (ref 26.6–33.0)
MCHC: 32.7 g/dL (ref 31.5–35.7)
MCV: 81 fL (ref 79–97)
Monocytes Absolute: 0.4 10*3/uL (ref 0.1–0.9)
Monocytes: 6 %
Neutrophils Absolute: 4 10*3/uL (ref 1.4–7.0)
Neutrophils: 58 %
Platelets: 312 10*3/uL (ref 150–379)
RBC: 4.78 x10E6/uL (ref 3.77–5.28)
RDW: 14 % (ref 12.3–15.4)
WBC: 6.9 10*3/uL (ref 3.4–10.8)

## 2017-05-05 LAB — LIPID PANEL W/O CHOL/HDL RATIO
CHOLESTEROL TOTAL: 145 mg/dL (ref 100–199)
HDL: 38 mg/dL — ABNORMAL LOW (ref >39)
LDL CALC: 93 mg/dL (ref 0–99)
Triglycerides: 71 mg/dL (ref 0–149)
VLDL CHOLESTEROL CAL: 14 mg/dL (ref 5–40)

## 2017-05-05 LAB — HGB A1C W/O EAG: Hgb A1c MFr Bld: 5.8 % — ABNORMAL HIGH (ref 4.8–5.6)

## 2017-05-06 ENCOUNTER — Ambulatory Visit: Payer: Self-pay | Admitting: Internal Medicine

## 2017-05-14 ENCOUNTER — Encounter: Payer: Self-pay | Admitting: Internal Medicine

## 2017-05-14 ENCOUNTER — Ambulatory Visit (INDEPENDENT_AMBULATORY_CARE_PROVIDER_SITE_OTHER): Payer: Self-pay | Admitting: Internal Medicine

## 2017-05-14 VITALS — BP 130/82 | HR 66 | Resp 12 | Ht 62.5 in | Wt 173.0 lb

## 2017-05-14 DIAGNOSIS — R928 Other abnormal and inconclusive findings on diagnostic imaging of breast: Secondary | ICD-10-CM

## 2017-05-14 DIAGNOSIS — E785 Hyperlipidemia, unspecified: Secondary | ICD-10-CM

## 2017-05-14 DIAGNOSIS — R7303 Prediabetes: Secondary | ICD-10-CM

## 2017-05-14 DIAGNOSIS — D509 Iron deficiency anemia, unspecified: Secondary | ICD-10-CM

## 2017-05-14 NOTE — Progress Notes (Signed)
   Subjective:    Patient ID: Katrina Richardson, female    DOB: [birth year], 41 y.o.   MRN: [id number]  HPI   1.  Anemia:  Hemoglobin is normal now at 12.7.  Taking Ferrous sulfate 325 mg daily.    2.  Prediabetes:  A1 remains at 5.8% despite weight loss of 16 lbs since December.  She does state she could healthier.  Eating a lot of tortillas.  Discussed diet at length.  Walking and going to [place] regularly.    3.  Dyslipidemia:    4.  Right breast possible fibroadenoma:  States never received notification to have repeat right breast ultrasound in May.   Current Meds  Medication Sig  . Calcium-Magnesium-Vitamin D 600-40-500 MG-MG-UNIT TB24 1 tab by mouth twice daily  . ferrous sulfate 325 (65 FE) MG tablet Take 325 mg by mouth daily with breakfast.  . ibuprofen (ADVIL,MOTRIN) 200 MG tablet Take 400 mg by mouth every 6 (six) hours as needed for mild pain or moderate pain. Reported on 05/15/2016    No Known Allergies  Review of Systems     Objective:   Physical Exam NAD Lungs:  CTA CV:  RRR without murmur or rub, radial and DP pulses normal and Equal Abd:  S, NT, No HSM or mass, + BS       Assessment & Plan:  1.  Anemia:  Resolved.  To main once daily dosing of iron supplermentation.  2.  Dyslipidemia:  To continue to work on eating and physical activity habits to get HDL up and maintain other numbers.  3.  Pre-diabetes:  Stable at 5.8% A1C.  As in #2.  4.  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  Scheduling.

## 2017-05-14 NOTE — Patient Instructions (Signed)

## 2017-05-19 ENCOUNTER — Other Ambulatory Visit: Payer: Self-pay | Admitting: Obstetrics and Gynecology

## 2017-05-19 DIAGNOSIS — D242 Benign neoplasm of left breast: Secondary | ICD-10-CM

## 2017-05-27 ENCOUNTER — Other Ambulatory Visit: Payer: Self-pay | Admitting: Obstetrics and Gynecology

## 2017-05-27 ENCOUNTER — Ambulatory Visit
Admission: RE | Admit: 2017-05-27 | Discharge: 2017-05-27 | Disposition: A | Discharge status: Home / Self Care | Payer: No Typology Code available for payment source | Source: Ambulatory Visit | Attending: Obstetrics and Gynecology | Admitting: Obstetrics and Gynecology

## 2017-05-27 DIAGNOSIS — N63 Unspecified lump in unspecified breast: Secondary | ICD-10-CM

## 2017-05-27 DIAGNOSIS — D242 Benign neoplasm of left breast: Secondary | ICD-10-CM

## 2017-05-27 DIAGNOSIS — Z1231 Encounter for screening mammogram for malignant neoplasm of breast: Secondary | ICD-10-CM

## 2017-06-04 ENCOUNTER — Other Ambulatory Visit: Payer: Self-pay | Admitting: Obstetrics and Gynecology

## 2017-06-04 DIAGNOSIS — N6310 Unspecified lump in the right breast, unspecified quadrant: Secondary | ICD-10-CM

## 2017-09-28 ENCOUNTER — Ambulatory Visit
Admission: RE | Admit: 2017-09-28 | Discharge: 2017-09-28 | Disposition: A | Discharge status: Home / Self Care | Payer: No Typology Code available for payment source | Source: Ambulatory Visit | Attending: Obstetrics and Gynecology | Admitting: Obstetrics and Gynecology

## 2017-09-28 ENCOUNTER — Other Ambulatory Visit: Payer: No Typology Code available for payment source

## 2017-09-28 ENCOUNTER — Encounter (HOSPITAL_COMMUNITY): Payer: Self-pay

## 2017-09-28 ENCOUNTER — Ambulatory Visit (HOSPITAL_COMMUNITY)
Admission: RE | Admit: 2017-09-28 | Discharge: 2017-09-28 | Disposition: A | Discharge status: Home / Self Care | Payer: Self-pay | Source: Ambulatory Visit | Attending: Obstetrics and Gynecology | Admitting: Obstetrics and Gynecology

## 2017-09-28 VITALS — BP 128/80 | Temp 98.8°F | Ht 62.0 in | Wt 184.4 lb

## 2017-09-28 DIAGNOSIS — N63 Unspecified lump in unspecified breast: Secondary | ICD-10-CM

## 2017-09-28 DIAGNOSIS — N6310 Unspecified lump in the right breast, unspecified quadrant: Secondary | ICD-10-CM

## 2017-09-28 DIAGNOSIS — Z1239 Encounter for other screening for malignant neoplasm of breast: Secondary | ICD-10-CM

## 2017-09-28 NOTE — Patient Instructions (Signed)
Explained breast self awareness with [name]. Patient did not need a Pap smear today due to last Pap smear was 08/20/2016. Let her know [facility]CP will cover Pap smears every 3 years unless has a history of abnormal Pap smears. Referred patient to the [facility] for diagnostic mammogram and possible right breast ultrasound per recommendation. Appointment scheduled for Tuesday, September 28, 2017 at 1520. Katrina Richardson verbalized understanding.  [name], Arvil Chaco, RN 2:53 PM

## 2017-09-28 NOTE — Progress Notes (Signed)
Patient referred to [facility] by the [place] due to recommending bilateral diagnostic mammogram and possible right breast ultrasound in 3 months. Last right breast diagnostic mammogram and ultrasound completed 05/27/2017.  Pap Smear: Pap smear not completed today. Last Pap smear was 08/20/2016 at [facility] and normal. Per patient has no history of an abnormal Pap smear. Last Pap smear result is in EPIC.  Physical exam: Breasts Breasts symmetrical. No skin abnormalities bilateral breasts. No nipple retraction bilateral breasts. No nipple discharge bilateral breasts. No lymphadenopathy. No lumps palpated bilateral breasts. No complaints of pain or tenderness on exam. Referred patient to the [facility] for diagnostic mammogram and possible right breast ultrasound per recommendation. Appointment scheduled for Tuesday, September 28, 2017 at 1520.        Pelvic/Bimanual No Pap smear completed today since last Pap smear was 08/20/2016. Pap smear not indicated per BCCCP guidelines.   Smoking History: Patient has never smoked.  Patient Navigation: Patient education provided. Access to services provided for patient through [facility] program. Spanish interpreter provided.  Used Spanish interpreter [employer] from [place].

## 2017-09-29 ENCOUNTER — Encounter (HOSPITAL_COMMUNITY): Payer: Self-pay | Admitting: *Deleted

## 2017-09-30 ENCOUNTER — Ambulatory Visit (HOSPITAL_COMMUNITY): Payer: No Typology Code available for payment source

## 2017-09-30 ENCOUNTER — Other Ambulatory Visit: Payer: No Typology Code available for payment source

## 2017-11-18 ENCOUNTER — Ambulatory Visit: Payer: Self-pay | Admitting: Internal Medicine

## 2017-11-18 ENCOUNTER — Encounter: Payer: Self-pay | Admitting: Internal Medicine

## 2017-11-18 VITALS — BP 120/70 | HR 70 | Resp 15 | Ht 62.5 in | Wt 184.5 lb

## 2017-11-18 DIAGNOSIS — Q809 Congenital ichthyosis, unspecified: Secondary | ICD-10-CM

## 2017-11-18 DIAGNOSIS — Z6833 Body mass index (BMI) 33.0-33.9, adult: Secondary | ICD-10-CM

## 2017-11-18 DIAGNOSIS — R7303 Prediabetes: Secondary | ICD-10-CM

## 2017-11-18 DIAGNOSIS — E6609 Other obesity due to excess calories: Secondary | ICD-10-CM

## 2017-11-18 NOTE — Progress Notes (Signed)
   Subjective:    Patient ID: Katrina Richardson, female    DOB: 11-May-1976, 42 y.o.   MRN: [id number]  HPI   1.  Prediabetes:  Has gained 11 lbs back.  She ate a lot during holidays.  She stopped being physically active mainly due to the colder weather.   She is planning to get back to [place].   Not clear she is motivated to get outside in different types of weather. She can work on diet even more so now.    2.  Obesity:  As above.   3.  Skin growths:  For about 2 months.  Whorl of brown lesions on right upper abdominal area and flank.  Started with pruritis in the area and felt her skin was just dry.  The next day she noted the whorl of brown lesions.  Had not noted any visible skin changes prior.   Had a similar lesion on right shoulder about 1 year ago.   Has used vaseline on the area, but started after the itching started.   No new applications before the itching started.   Current Meds  Medication Sig  . ferrous sulfate 325 (65 FE) MG tablet Take 325 mg by mouth daily with breakfast.  . ibuprofen (ADVIL,MOTRIN) 200 MG tablet Take 400 mg by mouth every 6 (six) hours as needed for mild pain or moderate pain. Reported on 05/15/2016    No Known Allergies    Review of Systems     Objective:   Physical Exam NAD HEENT:  PERRL, EOMI Neck:  Supple, No adenopathy or thyromegaly Chest:  CTA CV:  RRR without murmur or rub, radial pulses normal and equal LE:  No edema Skin:  Light brown, slightly raised whorling and linear lesions in right upper quad and flank.  Right axilla with similar lesions, though not raised  No lesions noted elsewhere.       Assessment & Plan:  1.  Prediabetes and obesity:  Discussed diet and physical activity, making small daily and weekly goals with both.  Recommended goal to get back to regular healthy diet and physical activity within 24-48 hours of eating differently or a pause in physical activity, even during holidays. Discussed being seen  more frequently to stay on task. 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

## 2017-11-18 NOTE — Patient Instructions (Addendum)
Tome un vaso de agua antes de cada comida Tome un minimo de 6 a 8 vasos de agua diarios Coma tres veces al dia Coma una proteina y Ardelia Mems grasa saludable con comida.  (huevos, pescado, pollo, pavo, y limite carnes rojas Coma 5 porciones diarias de legumbres.  Mezcle los colores Coma 2 porciones diarias de frutas con cascara cuando sea comestible Use platos pequeos Suelte su tenedor o cuchara despues de cada mordida hata que se mastique y se trague Come en la mesa con amigos o familiares por lo menos una vez al dia [place] la televisin y aparatos electrnicos durante la comida  Su objetivo debe ser perder una libra por semana  Estudios recientes indican que las personas quienes consumen todos de sus calorias durante 12 horas se bajan de pesocon Mas eficiencia.  Por ejemplo, si Usted come su primera comida a las 7:00 a.m., su comida final del dia se debe completar antes de las 7:00 p.m.  Neutrogena with salicylic acid to areas of rash once to twice daily Eucerin for eczema relief twice daily all over Dove soap for bathing.

## 2017-11-19 LAB — HGB A1C W/O EAG: HEMOGLOBIN A1C: 5.8 % — AB (ref 4.8–5.6)

## 2018-02-17 ENCOUNTER — Ambulatory Visit: Payer: Self-pay | Admitting: Internal Medicine

## 2018-02-17 ENCOUNTER — Encounter: Payer: Self-pay | Admitting: Internal Medicine

## 2018-02-17 VITALS — BP 118/72 | HR 78 | Resp 12 | Ht 62.5 in | Wt 189.0 lb

## 2018-02-17 DIAGNOSIS — Q809 Congenital ichthyosis, unspecified: Secondary | ICD-10-CM

## 2018-02-17 DIAGNOSIS — E669 Obesity, unspecified: Secondary | ICD-10-CM

## 2018-02-17 DIAGNOSIS — R7303 Prediabetes: Secondary | ICD-10-CM

## 2018-02-17 DIAGNOSIS — Z6834 Body mass index (BMI) 34.0-34.9, adult: Secondary | ICD-10-CM

## 2018-02-17 NOTE — Patient Instructions (Signed)
Neutrogena with salicylic acid to areas of rash once to twice daily--look in skin care/acne area Eucerin for eczema relief twice daily all over--look in eczema care area Dove soap for bathing.

## 2018-02-17 NOTE — Progress Notes (Signed)
   Subjective:    Patient ID: Katrina Richardson, female    DOB: 09-19-1976, 42 y.o.   MRN: [id number]  HPI   1.  Obesity/prediabetes:  States she is eating less and going out to walk when she can.  She continues to gain weight, however.  Up about 16 lbs from last July.  Last A1C stable at 5.8%. 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  So really only walking on weekends.  Diet actually fairly healthy, though could cut back on soda here and there.  May have a snack of fruit when gets hungry during the day.  Was able to exercise more last year and lose weight, but now with job, seems more hungry.  2.  Frequent periods:  Has started having periods twice monthly:  Feb and March. Bleeding length and amount was the same each time.  No period in month of April.  Starting to have sense of feeling cold and then sweating at night.  She has always had knee pain prior to her periods starting.  She did note this discomfort before both episodes of menstruation in Feb and March.  She has not had the knee discomfort in April She has not had any other premenstruation symptoms in the past.   She is status post BTL in 2003.     3.  ?Ichthyosis Linearis:  Did not get the topicals recommended at last visit.  States she could not find them.  She states the skin lesion has not changed.    Current Meds  Medication Sig  . Calcium-Magnesium-Vitamin D 600-40-500 MG-MG-UNIT TB24 1 tab by mouth twice daily  . ferrous sulfate 325 (65 FE) MG tablet Take 325 mg by mouth daily with breakfast.  . ibuprofen (ADVIL,MOTRIN) 200 MG tablet Take 400 mg by mouth every 6 (six) hours as needed for mild pain or moderate pain. Reported on 05/15/2016   No Known Allergies    Review of Systems     Objective:   Physical Exam NAD Lungs:   CTA CV:  RRR without murmur or rub, radial and DP pulses normal and equal. LE:  No edema. Skin:  Whorled and linear light brown lesions right axilla, upper right quadrant and  flank unchanged.       Assessment & Plan:  1.  Obesity;  Referral to Burnis Medin to see if he can identify areas of improvement in her diet with more detailed discussion as well as where she might be able to add in some physical activity.  2.  Prediabetes:  As above.  3.  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 Topicals discussed again.

## 2018-05-09 DIAGNOSIS — Q809 Congenital ichthyosis, unspecified: Secondary | ICD-10-CM | POA: Insufficient documentation

## 2018-05-09 DIAGNOSIS — E66811 Obesity, class 1: Secondary | ICD-10-CM | POA: Insufficient documentation

## 2018-05-09 DIAGNOSIS — Z6834 Body mass index (BMI) 34.0-34.9, adult: Secondary | ICD-10-CM

## 2018-05-09 DIAGNOSIS — E669 Obesity, unspecified: Secondary | ICD-10-CM | POA: Insufficient documentation

## 2018-05-19 ENCOUNTER — Encounter: Payer: Self-pay | Admitting: Internal Medicine

## 2018-05-19 ENCOUNTER — Ambulatory Visit: Payer: Self-pay | Admitting: Internal Medicine

## 2018-05-19 VITALS — BP 110/82 | HR 78 | Resp 12 | Ht 62.5 in | Wt 181.0 lb

## 2018-05-19 DIAGNOSIS — E669 Obesity, unspecified: Secondary | ICD-10-CM

## 2018-05-19 DIAGNOSIS — Q809 Congenital ichthyosis, unspecified: Secondary | ICD-10-CM

## 2018-05-19 DIAGNOSIS — Z6834 Body mass index (BMI) 34.0-34.9, adult: Secondary | ICD-10-CM

## 2018-05-19 DIAGNOSIS — R7303 Prediabetes: Secondary | ICD-10-CM

## 2018-05-19 NOTE — Progress Notes (Signed)
   Subjective:    Patient ID: Katrina Richardson, female    DOB: October 20, 1976, 42 y.o.   MRN: [id number]  HPI   1.  Obesity:  Did not make her appt with Burnis Medin for dietary and exercise counseling.   On her own, she has cut back on intake.  She is now walking more now.  Walking 3-4 times weekly for about 40-60 minutes.    If she is hungry now in between meals, will drink water or eat fruit.  Discussed using vegetables here as well.    Her two children, ages 22 and 56.    2.  Ichthyosis linearis circumflexa:  Did obtain salycylic acid to lesions once daily.  Also Eucerin Cram for eczema relief.  She feels the lesions are fading somewhat, particularly in the right axillary area.  Current Meds  Medication Sig  . Calcium-Magnesium-Vitamin D 600-40-500 MG-MG-UNIT TB24 1 tab by mouth twice daily  . ferrous sulfate 325 (65 FE) MG tablet Take 325 mg by mouth daily with breakfast.  . ibuprofen (ADVIL,MOTRIN) 200 MG tablet Take 400 mg by mouth every 6 (six) hours as needed for mild pain or moderate pain. Reported on 05/15/2016   No Known Allergies   Review of Systems     Objective:   Physical Exam  NAD Skin:  Light brown whorling in right axilla and right upper abdominal area/flank.  Appears to be fading and nonpalpable now over the latter that was raised previously     Assessment & Plan:  1.  Prediabtes/Obesity:  Much better with lifestyle changes.  Down 8 lbs from April.  Encouraged getting children to make lifestyle changes with her, including her 76 yo son who is on a traveling Scientist, water quality and likely eating a lot of fast food.  2.  Ichthyosis Linearis:  Continue with Salicylic Acid and Eucerin cream.  Appears to be fading.    3.  HM:  Schedule CPE without pap in 4 months

## 2018-08-22 ENCOUNTER — Other Ambulatory Visit (HOSPITAL_COMMUNITY): Payer: Self-pay | Admitting: *Deleted

## 2018-08-22 ENCOUNTER — Other Ambulatory Visit: Payer: Self-pay | Admitting: Obstetrics and Gynecology

## 2018-08-22 DIAGNOSIS — Z1231 Encounter for screening mammogram for malignant neoplasm of breast: Secondary | ICD-10-CM

## 2018-08-22 DIAGNOSIS — D241 Benign neoplasm of right breast: Secondary | ICD-10-CM

## 2018-09-12 ENCOUNTER — Other Ambulatory Visit: Payer: Self-pay

## 2018-09-12 DIAGNOSIS — E785 Hyperlipidemia, unspecified: Secondary | ICD-10-CM

## 2018-09-12 DIAGNOSIS — Z79899 Other long term (current) drug therapy: Secondary | ICD-10-CM

## 2018-09-13 LAB — CBC WITH DIFFERENTIAL/PLATELET
BASOS ABS: 0 10*3/uL (ref 0.0–0.2)
BASOS: 1 %
EOS (ABSOLUTE): 0.1 10*3/uL (ref 0.0–0.4)
Eos: 1 %
Hematocrit: 37.4 % (ref 34.0–46.6)
Hemoglobin: 12.1 g/dL (ref 11.1–15.9)
Immature Grans (Abs): 0 10*3/uL (ref 0.0–0.1)
Immature Granulocytes: 0 %
LYMPHS: 32 %
Lymphocytes Absolute: 2.3 10*3/uL (ref 0.7–3.1)
MCH: 25.7 pg — AB (ref 26.6–33.0)
MCHC: 32.4 g/dL (ref 31.5–35.7)
MCV: 80 fL (ref 79–97)
MONOS ABS: 0.4 10*3/uL (ref 0.1–0.9)
Monocytes: 6 %
NEUTROS ABS: 4.2 10*3/uL (ref 1.4–7.0)
NEUTROS PCT: 60 %
PLATELETS: 320 10*3/uL (ref 150–450)
RBC: 4.7 x10E6/uL (ref 3.77–5.28)
RDW: 14.4 % (ref 12.3–15.4)
WBC: 7 10*3/uL (ref 3.4–10.8)

## 2018-09-13 LAB — COMPREHENSIVE METABOLIC PANEL
A/G RATIO: 1.3 (ref 1.2–2.2)
ALK PHOS: 70 IU/L (ref 39–117)
ALT: 15 IU/L (ref 0–32)
AST: 17 IU/L (ref 0–40)
Albumin: 4.1 g/dL (ref 3.5–5.5)
BUN/Creatinine Ratio: 23 (ref 9–23)
BUN: 14 mg/dL (ref 6–24)
CO2: 23 mmol/L (ref 20–29)
Calcium: 9 mg/dL (ref 8.7–10.2)
Chloride: 99 mmol/L (ref 96–106)
Creatinine, Ser: 0.62 mg/dL (ref 0.57–1.00)
GFR calc non Af Amer: 112 mL/min/{1.73_m2} (ref >59)
GFR, EST AFRICAN AMERICAN: 129 mL/min/{1.73_m2} (ref >59)
GLUCOSE: 101 mg/dL — AB (ref 65–99)
Globulin, Total: 3.2 g/dL (ref 1.5–4.5)
POTASSIUM: 4.4 mmol/L (ref 3.5–5.2)
Sodium: 137 mmol/L (ref 134–144)
Total Protein: 7.3 g/dL (ref 6.0–8.5)

## 2018-09-13 LAB — LIPID PANEL W/O CHOL/HDL RATIO
CHOLESTEROL TOTAL: 149 mg/dL (ref 100–199)
HDL: 33 mg/dL — AB (ref >39)
LDL CALC: 85 mg/dL (ref 0–99)
Triglycerides: 157 mg/dL — ABNORMAL HIGH (ref 0–149)
VLDL CHOLESTEROL CAL: 31 mg/dL (ref 5–40)

## 2018-09-15 ENCOUNTER — Encounter: Payer: Self-pay | Admitting: Internal Medicine

## 2018-09-15 ENCOUNTER — Ambulatory Visit: Payer: Self-pay | Admitting: Internal Medicine

## 2018-09-15 VITALS — BP 122/80 | HR 80 | Resp 12 | Ht 62.5 in | Wt 184.0 lb

## 2018-09-15 DIAGNOSIS — D649 Anemia, unspecified: Secondary | ICD-10-CM | POA: Insufficient documentation

## 2018-09-15 DIAGNOSIS — Q809 Congenital ichthyosis, unspecified: Secondary | ICD-10-CM

## 2018-09-15 DIAGNOSIS — R7303 Prediabetes: Secondary | ICD-10-CM

## 2018-09-15 DIAGNOSIS — E785 Hyperlipidemia, unspecified: Secondary | ICD-10-CM

## 2018-09-15 DIAGNOSIS — Z0000 Encounter for general adult medical examination without abnormal findings: Secondary | ICD-10-CM

## 2018-09-15 DIAGNOSIS — E669 Obesity, unspecified: Secondary | ICD-10-CM

## 2018-09-15 DIAGNOSIS — Z6834 Body mass index (BMI) 34.0-34.9, adult: Secondary | ICD-10-CM

## 2018-09-15 DIAGNOSIS — Z23 Encounter for immunization: Secondary | ICD-10-CM

## 2018-09-15 DIAGNOSIS — R928 Other abnormal and inconclusive findings on diagnostic imaging of breast: Secondary | ICD-10-CM

## 2018-09-15 NOTE — Patient Instructions (Signed)

## 2018-09-15 NOTE — Progress Notes (Signed)
Subjective:    Patient ID: Katrina Richardson, female    DOB: November 22, 1975, 42 y.o.   MRN: [id number]  HPI   CPE without pap  1.  Pap:  Last pap 07/2016 and normal.  Always normal.  No family history of cervical cancer.  2.  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  Has appt already set up to have that re visualized for stability next month.  No family history of breast cancer.  3.  Osteoprevention:  She eats cheese, but not milk or yogurt.  Not clear she would drink 3 servings of the almond milk she uses in coffee.  Not physically active currently.  4.  Guaiac Cards:  Negative when performed in 2017.    5.  Colonoscopy:  Never.  No family history of colon cancer.  6.  Immunizations:   Immunization History  Administered Date(s) Administered  . Influenza Inj Mdck Quad Pf 08/20/2016  . Tdap 08/20/2016    7.  Glucose/Cholesterol:  History of prediabetes.  Did not get A1C with labs 3 days ago.   Cholesterol total ok, but trigs and HDL a little worse.  Lipid Panel     Component Value Date/Time   CHOL 149 09/12/2018 0840   TRIG 157 (H) 09/12/2018 0840   HDL 33 (L) 09/12/2018 0840   LDLCALC 85 09/12/2018 0840   Current Meds  Medication Sig  . Calcium-Magnesium-Vitamin D 600-40-500 MG-MG-UNIT TB24 1 tab by mouth twice daily  . ferrous sulfate 325 (65 FE) MG tablet Take 325 mg by mouth daily with breakfast.  . ibuprofen (ADVIL,MOTRIN) 200 MG tablet Take 400 mg by mouth every 6 (six) hours as needed for mild pain or moderate pain. Reported on 05/15/2016    No Known Allergies   Past Medical History:  Diagnosis Date  . Anemia    resolved  . Dyslipidemia   . Prediabetes 10/14/2016    Past Surgical History:  Procedure Laterality Date  . [place], 2003   3 previous  . TUBAL LIGATION  2003    Family History  Problem Relation Age of Onset  . Hypertension Father     Social History   Socioeconomic History  .  Marital status: Single    Spouse name: Not on file  . Number of children: 3  . Years of education: 6  . Highest education level: Not on file  Occupational History  . Occupation: Marketing executive  Social Needs  . Financial resource strain: Not on file  . Food insecurity:    Worry: Never true    Inability: Never true  . Transportation needs:    Medical: No    Non-medical: No  Tobacco Use  . Smoking status: Never Smoker  . Smokeless tobacco: Never Used  Substance and Sexual Activity  . Alcohol use: No    Alcohol/week: 0.0 standard drinks  . Drug use: No  . Sexual activity: Yes    Partners: Male    Birth control/protection: Surgical    Comment: Tubal ligation 13 years ago in [place]  Lifestyle  . Physical activity:    Days per week: 4 days    Minutes per session: 50 min  . Stress: Not at all  Relationships  . Social connections:    Talks on phone: More than three times a week    Gets together: Once a week    Attends religious service: 1 to 4 times per year    Active member  of club or organization: No    Attends meetings of clubs or organizations: Never    Relationship status: Living with partner  . Intimate partner violence:    Fear of current or ex partner: No    Emotionally abused: No    Physically abused: No    Forced sexual activity: No  Other Topics Concern  . Not on file  Social History Narrative      [place]   Lives with her 2 younger children   Long term boyfriend that does live with her--not father of her children.     Review of Systems  Constitutional: Negative for appetite change and fatigue.  HENT: Negative for dental problem (Had teeth cleaned recently with dental clinic.), hearing loss, rhinorrhea, sinus pressure and sore throat.   Eyes: Negative for visual disturbance.  Respiratory: Negative for cough and shortness of breath.   Cardiovascular: Negative for chest pain, palpitations and leg swelling.  Gastrointestinal:  Negative for abdominal pain, blood in stool (No melena), constipation and diarrhea.  Genitourinary: Negative for dysuria, frequency, menstrual problem and vaginal discharge.  Musculoskeletal: Negative for arthralgias.  Skin: Negative for rash.  Neurological: Negative for weakness and numbness.  Psychiatric/Behavioral: Negative for dysphoric mood. The patient is not nervous/anxious.        Objective:   Physical Exam  Constitutional: She is oriented to person, place, and time. She appears well-developed and well-nourished.  HENT:  Head: Normocephalic and atraumatic.  Right Ear: Hearing, tympanic membrane, external ear and ear canal normal.  Left Ear: Hearing, tympanic membrane, external ear and ear canal normal.  Nose: Nose normal.  Mouth/Throat: Uvula is midline, oropharynx is clear and moist and mucous membranes are normal.  Eyes: Pupils are equal, round, and reactive to light. Conjunctivae and EOM are normal.  Discs sharp bilaterally  Neck: Normal range of motion and full passive range of motion without pain. Neck supple. No thyromegaly present.  Cardiovascular: Normal rate, regular rhythm, S1 normal and S2 normal. Exam reveals no friction rub.  No murmur heard. No carotid bruits.  Carotid, radial, femoral, DP and PT pulses normal and equal.   Pulmonary/Chest: Effort normal and breath sounds normal. Right breast exhibits no inverted nipple, no mass, no nipple discharge, no skin change and no tenderness. Left breast exhibits no inverted nipple, no mass, no nipple discharge, no skin change and no tenderness.  Abdominal: Soft. Bowel sounds are normal. She exhibits no mass. There is no hepatosplenomegaly. There is no tenderness. No hernia.  Genitourinary: Rectal exam shows no mass and guaiac negative stool.  Genitourinary Comments: Normal external female genitalia No uterine or adnexal mass or tenderness.   Musculoskeletal: Normal range of motion.  Lymphadenopathy:       Head (right  side): No submental and no submandibular adenopathy present.       Head (left side): No submental and no submandibular adenopathy present.    She has no cervical adenopathy.    She has no axillary adenopathy.       Right: No inguinal and no supraclavicular adenopathy present.       Left: No inguinal and no supraclavicular adenopathy present.  Neurological: She is alert and oriented to person, place, and time. She has normal strength and normal reflexes. No cranial nerve deficit or sensory deficit. Coordination and gait normal.  Skin: Skin is warm. Capillary refill takes less than 2 seconds. Lesion noted.     Psychiatric: She has a normal mood and affect. Her  speech is normal and behavior is normal. Judgment and thought content normal. Cognition and memory are normal.          Assessment & Plan:  CPE without pap Pap next year. Mammogram for follow up of what is felt to be benign fibroadenoma of right breast already scheduled for next month. Influenza vaccine. Encouraged more calcium and Vitamin D rich foods rather than vitamins.  Prediabetes:  Add A1C to labs drawn 3 days ago.  See below.  Dyslipidemia:  Urged to get back to regular physical activity and eating better.  Whorled skin lesions:  Gradually fading.  Follow up in 6 months

## 2018-09-29 ENCOUNTER — Other Ambulatory Visit (INDEPENDENT_AMBULATORY_CARE_PROVIDER_SITE_OTHER): Payer: Self-pay

## 2018-09-29 DIAGNOSIS — Z1211 Encounter for screening for malignant neoplasm of colon: Secondary | ICD-10-CM

## 2018-09-29 LAB — POC HEMOCCULT BLD/STL (HOME/3-CARD/SCREEN)
Card #2 Fecal Occult Blod, POC: NEGATIVE
FECAL OCCULT BLD: NEGATIVE
FECAL OCCULT BLD: NEGATIVE

## 2018-11-24 ENCOUNTER — Other Ambulatory Visit: Payer: No Typology Code available for payment source

## 2018-11-24 ENCOUNTER — Ambulatory Visit (HOSPITAL_COMMUNITY): Payer: No Typology Code available for payment source

## 2018-12-13 ENCOUNTER — Encounter (HOSPITAL_COMMUNITY): Payer: Self-pay

## 2018-12-13 ENCOUNTER — Encounter: Payer: Self-pay | Admitting: Student

## 2018-12-13 ENCOUNTER — Ambulatory Visit
Admission: RE | Admit: 2018-12-13 | Discharge: 2018-12-13 | Disposition: A | Discharge status: Home / Self Care | Payer: No Typology Code available for payment source | Source: Ambulatory Visit | Attending: Obstetrics and Gynecology | Admitting: Obstetrics and Gynecology

## 2018-12-13 ENCOUNTER — Ambulatory Visit (HOSPITAL_COMMUNITY)
Admission: RE | Admit: 2018-12-13 | Discharge: 2018-12-13 | Disposition: A | Discharge status: Home / Self Care | Payer: No Typology Code available for payment source | Source: Ambulatory Visit | Attending: Obstetrics and Gynecology | Admitting: Obstetrics and Gynecology

## 2018-12-13 VITALS — BP 128/74 | Ht 62.0 in | Wt 186.0 lb

## 2018-12-13 DIAGNOSIS — Z1239 Encounter for other screening for malignant neoplasm of breast: Secondary | ICD-10-CM

## 2018-12-13 DIAGNOSIS — D241 Benign neoplasm of right breast: Secondary | ICD-10-CM

## 2018-12-13 NOTE — Patient Instructions (Signed)
Explained breast self awareness with [name]. Patient did not need a Pap smear today due to last Pap smear was 08/20/2016. Let her know BCCCP will cover Pap smears every 3 years unless has a history of abnormal Pap smears. Referred patient to the [facility] for diagnostic mammogram and possible right breast ultrasound per recommendation. Appointment scheduled for Tuesday, December 13, 2018 at 1350. Patient aware of appointment and will be there. [name] verbalized understanding.  [name], Arvil Chaco, RN 4:23 PM

## 2018-12-13 NOTE — Progress Notes (Signed)
Patient referred to [facility] by the [place] due to recommending bilateral diagnostic mammogram and possible right breast ultrasound in 12 months. Last bilateral diagnostic mammogram and right breast ultrasound was completed 09/28/2017.  Pap Smear: Pap smear not completed today. Last Pap smear was 08/20/2016 at [facility] and normal. Per patient has no history of an abnormal Pap smear. Last Pap smear result is in EPIC.  Physical exam: Breasts Breasts symmetrical. No skin abnormalities bilateral breasts. No nipple retraction bilateral breasts. No nipple discharge bilateral breasts. No lymphadenopathy. No lumps palpated bilateral breasts. No complaints of pain or tenderness on exam. Referred patient to the [facility] for diagnostic mammogram and possible right breast ultrasound per recommendation. Appointment scheduled for Tuesday, December 13, 2018 at 1350.        Pelvic/Bimanual No Pap smear completed today since last Pap smear was 08/20/2016. Pap smear not indicated per BCCCP guidelines.  Smoking History: Patient has never smoked.  Patient Navigation: Patient education provided. Access to services provided for patient through [facility] program. Spanish interpreter provided.   Colorectal Cancer Screening: Per patient has never had a colonoscopy completed. Patient completed a FIT Test 09/29/2018 that was negative. No complaints today.   Breast and Cervical Cancer Risk Assessment: Patient has no family history of breast cancer, known genetic mutations, or radiation treatment to the chest before age 40. Patient has no history of cervical dysplasia, immunocompromised, or DES exposure in-utero.  Risk Assessment    Risk Scores      12/13/2018   Last edited by: Loletta Parish, RN   5-year risk: 0.3 %   Lifetime risk: 4.6 %         Used Spanish interpreter Rudene Anda from [place].

## 2019-03-13 ENCOUNTER — Other Ambulatory Visit: Payer: Self-pay

## 2019-03-13 DIAGNOSIS — R7303 Prediabetes: Secondary | ICD-10-CM

## 2019-03-14 LAB — HGB A1C W/O EAG: Hgb A1c MFr Bld: 5.8 % — ABNORMAL HIGH (ref 4.8–5.6)

## 2019-03-16 ENCOUNTER — Ambulatory Visit: Payer: Self-pay | Admitting: Internal Medicine

## 2019-03-16 ENCOUNTER — Other Ambulatory Visit: Payer: Self-pay

## 2019-03-16 ENCOUNTER — Encounter: Payer: Self-pay | Admitting: Internal Medicine

## 2019-03-16 VITALS — BP 118/80 | HR 76 | Resp 12 | Ht 62.5 in | Wt 180.0 lb

## 2019-03-16 DIAGNOSIS — E669 Obesity, unspecified: Secondary | ICD-10-CM

## 2019-03-16 DIAGNOSIS — Z6834 Body mass index (BMI) 34.0-34.9, adult: Secondary | ICD-10-CM

## 2019-03-16 DIAGNOSIS — R7303 Prediabetes: Secondary | ICD-10-CM

## 2019-03-16 NOTE — Progress Notes (Signed)
    Subjective:    Patient ID: Katrina Richardson, female   DOB: 06-18-1976, 43 y.o.   MRN: [id number]   HPI   1.  Prediabetes/weight issues:  Discussed stable at 5.8% with A1C and down 4 lbs.  She is not walking much--abour 30 minutes daily.  She can increase this especially as not working during pandemic.   Her family still has 2 people in family working with income, so they are fine with food, etc.    Current Meds  Medication Sig  . Calcium-Magnesium-Vitamin D 600-40-500 MG-MG-UNIT TB24 1 tab by mouth twice daily  . ferrous sulfate 325 (65 FE) MG tablet Take 325 mg by mouth daily with breakfast.  . ibuprofen (ADVIL,MOTRIN) 200 MG tablet Take 400 mg by mouth every 6 (six) hours as needed for mild pain or moderate pain. Reported on 05/15/2016   No Known Allergies   Review of Systems    Objective:   BP 118/80 (BP Location: Left Arm, Patient Position: Sitting, Cuff Size: Normal)   Pulse 76   Resp 12   Ht 5' 2.5" (1.588 m)   Wt 180 lb (81.6 kg)   LMP 03/13/2019   BMI 32.40 kg/m    Lungs:  CTA CV: RRR without murmur or rub.  Radial pulses normal and equal.  LE without edema  Physical Exam   Assessment & Plan   Prediabetes/weight issues:  To continue to increase her physical activity to at least 60 minutes daily and continue to work on diet. Stable. CPE with pap end of October

## 2019-03-16 NOTE — Patient Instructions (Signed)
Walk twice daily and get up to 60 minutes

## 2019-08-24 ENCOUNTER — Encounter: Payer: Self-pay | Admitting: Internal Medicine

## 2019-08-24 ENCOUNTER — Other Ambulatory Visit: Payer: Self-pay | Admitting: Internal Medicine

## 2019-08-24 ENCOUNTER — Other Ambulatory Visit: Payer: Self-pay

## 2019-08-24 ENCOUNTER — Ambulatory Visit: Payer: Self-pay | Admitting: Internal Medicine

## 2019-08-24 VITALS — BP 122/82 | HR 68 | Resp 12 | Ht 62.5 in | Wt 184.0 lb

## 2019-08-24 DIAGNOSIS — Z6834 Body mass index (BMI) 34.0-34.9, adult: Secondary | ICD-10-CM

## 2019-08-24 DIAGNOSIS — R7303 Prediabetes: Secondary | ICD-10-CM

## 2019-08-24 DIAGNOSIS — E669 Obesity, unspecified: Secondary | ICD-10-CM

## 2019-08-24 DIAGNOSIS — Z1231 Encounter for screening mammogram for malignant neoplasm of breast: Secondary | ICD-10-CM

## 2019-08-24 DIAGNOSIS — E785 Hyperlipidemia, unspecified: Secondary | ICD-10-CM

## 2019-08-24 DIAGNOSIS — E010 Iodine-deficiency related diffuse (endemic) goiter: Secondary | ICD-10-CM | POA: Insufficient documentation

## 2019-08-24 DIAGNOSIS — N949 Unspecified condition associated with female genital organs and menstrual cycle: Secondary | ICD-10-CM

## 2019-08-24 DIAGNOSIS — Z0000 Encounter for general adult medical examination without abnormal findings: Secondary | ICD-10-CM

## 2019-08-24 DIAGNOSIS — Z124 Encounter for screening for malignant neoplasm of cervix: Secondary | ICD-10-CM

## 2019-08-24 DIAGNOSIS — Z23 Encounter for immunization: Secondary | ICD-10-CM

## 2019-08-24 DIAGNOSIS — Z114 Encounter for screening for human immunodeficiency virus [HIV]: Secondary | ICD-10-CM

## 2019-08-24 LAB — POCT WET PREP WITH KOH
KOH Prep POC: NEGATIVE
RBC Wet Prep HPF POC: NEGATIVE
Trichomonas, UA: NEGATIVE
Yeast Wet Prep HPF POC: NEGATIVE

## 2019-08-24 MED ORDER — ACYCLOVIR 400 MG PO TABS: ORAL_TABLET | ORAL | 0 refills | Status: DC

## 2019-08-24 MED ORDER — ACYCLOVIR 400 MG PO TABS: ORAL_TABLET | ORAL | 11 refills | Status: AC

## 2019-08-24 NOTE — Progress Notes (Signed)
Subjective:    Patient ID: Katrina Richardson, female   DOB: [birth year], 43 y.o.   MRN: [id number]   HPI   CPE with pap  1.  Pap:  Last pap 07/2016 and normal.    2.  Mammogram:  States she had a mammogram last year, but unable to see she did.  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  No family history of breast cancer  3.  Osteoprevention:  Not much in way of dairy daily.  Does not like cow's milk.  Does walk daily.  No family history of osteoporosis.  4.  Guaiac Cards:  Performed 12.2019 and negative.  5.  Colonoscopy:  Never.  No family history of colon cancer.   6.  Immunizations:   Immunization History  Administered Date(s) Administered   Influenza Inj Mdck Quad Pf 08/20/2016, 09/15/2018   Tdap 08/20/2016     7.  Glucose/Cholesterol:  Prediabetes.  Last A1C 03/13/2019:  5.8%, stable.   Dyslipidemia with low HDL and high triglycerides last checked 08/2018.  Lipid Panel     Component Value Date/Time   CHOL 149 09/12/2018 0840   TRIG 157 (H) 09/12/2018 0840   HDL 33 (L) 09/12/2018 0840   LDLCALC 85 09/12/2018 0840   LABVLDL 31 09/12/2018 0840     Current Meds  Medication Sig   Calcium-Magnesium-Vitamin D 600-40-500 MG-MG-UNIT TB24 1 tab by mouth twice daily   ferrous sulfate 325 (65 FE) MG tablet Take 325 mg by mouth daily with breakfast.   ibuprofen (ADVIL,MOTRIN) 200 MG tablet Take 400 mg by mouth every 6 (six) hours as needed for mild pain or moderate pain. Reported on 05/15/2016   No Known Allergies   Past Medical History:  Diagnosis Date   Anemia    resolved   Dyslipidemia    Prediabetes 10/14/2016   Past Surgical History:  Procedure Laterality Date   CESAREAN SECTION  1996, 1997, 2003   3 previous   TUBAL LIGATION  2003   Family History  Problem Relation Age of Onset   Hypertension Father    Heart disease Father     Social History   Socioeconomic History     Marital status: Domestic Partner    Spouse name: Glenard Haring   Number of children: 3   Years of education: 6   Highest education level: Not on file  Occupational History   Occupation: Currently not working outside of home.  Social Designer, fashion/clothing strain: Not on file   Food insecurity    Worry: Never true    Inability: Never true   Transportation needs    Medical: No    Non-medical: No  Tobacco Use   Smoking status: Never Smoker   Smokeless tobacco: Never Used  Substance and Sexual Activity   Alcohol use: Yes    Comment: Rare   Drug use: No   Sexual activity: Yes    Partners: Male    Birth control/protection: Surgical    Comment: Tubal ligation 13 years ago in [place]  Lifestyle   Physical activity    Days per week: 4 days    Minutes per session: 50 min   Stress: Not at all  Relationships   Social connections    Talks on phone: More than three times a week    Gets together: Once a week    Attends religious service: 1 to 4 times per year    Active member  of club or organization: No    Attends meetings of clubs or organizations: Never    Relationship status: Living with partner   Intimate partner violence    Fear of current or ex partner: No    Emotionally abused: No    Physically abused: No    Forced sexual activity: No  Other Topics Concern   Not on file  Social History Narrative   Only her daughter lives with her and her long term boyfriend, Glenard Haring now.   Glenard Haring is not the father of her children      Review of Systems  Constitutional: Negative for appetite change, fatigue and fever.  HENT: Negative for dental problem, ear pain, hearing loss, rhinorrhea and sore throat.   Eyes: Negative for visual disturbance.  Respiratory: Negative for cough and shortness of breath.   Cardiovascular: Negative for chest pain, palpitations and leg swelling.  Gastrointestinal: Negative for abdominal pain, blood in stool (No melena), constipation and  diarrhea.  Genitourinary: Negative for dysuria, frequency and vaginal discharge.       Has a pimple on her vaginal area.  Popped it about a week ago, but recurred yesterday and larger.  Mild tenderness. Later states she gets a pimple on her mouth and her vaginal area when she has a period.  Then states has had this only twice.    Musculoskeletal: Negative for arthralgias.  Skin: Negative for rash.  Neurological: Negative for weakness and numbness.  Psychiatric/Behavioral: Negative for dysphoric mood. The patient is not nervous/anxious.       Objective:   BP 122/82 (BP Location: Left Arm, Patient Position: Sitting, Cuff Size: Normal)    Pulse 68    Resp 12    Ht 5' 2.5" (1.588 m)    Wt 184 lb (83.5 kg)    LMP 08/05/2019    BMI 33.12 kg/m   Physical Exam  Constitutional: She is oriented to person, place, and time. She appears well-developed and well-nourished.  HENT:  Head: Normocephalic and atraumatic.  Right Ear: Hearing, tympanic membrane, external ear and ear canal normal.  Left Ear: Hearing, tympanic membrane, external ear and ear canal normal.  Nose: Nose normal.  Mouth/Throat: Uvula is midline, oropharynx is clear and moist and mucous membranes are normal.  Eyes: Pupils are equal, round, and reactive to light. Conjunctivae and EOM are normal.  Discs sharp   Neck: Normal range of motion and full passive range of motion without pain. Neck supple. Thyromegaly present. No thyroid mass present.  Cardiovascular: Normal rate, regular rhythm, S1 normal and S2 normal. Exam reveals no S3, no S4 and no friction rub.  No murmur heard. No carotid bruits.  Carotid, radial, femoral, DP and PT pulses normal and equal.   Pulmonary/Chest: Effort normal and breath sounds normal. Right breast exhibits no inverted nipple, no mass, no nipple discharge, no skin change and no tenderness. Left breast exhibits no inverted nipple, no mass, no nipple discharge, no skin change and no tenderness.  Abdominal:  Soft. Bowel sounds are normal. She exhibits no mass. There is no hepatosplenomegaly. There is no abdominal tenderness. No hernia.  Genitourinary:       Genitourinary Comments: Save for genital lesion, normal external female genitalia. No cervical lesions or inflammation. No uterine or adnexal mass or tenderness. Rectal:  No mass, but due to blood on fingertip from unroofing of genital lesion, unable to perform Guaiac testing today.   Musculoskeletal: Normal range of motion.  Lymphadenopathy:  Head (right side): No submental and no submandibular adenopathy present.       Head (left side): No submental and no submandibular adenopathy present.    She has no cervical adenopathy.    She has no axillary adenopathy.       Right: No inguinal and no supraclavicular adenopathy present.       Left: No inguinal and no supraclavicular adenopathy present.  Neurological: She is alert and oriented to person, place, and time. She has normal strength and normal reflexes. No cranial nerve deficit or sensory deficit. Coordination and gait normal.  Skin: Skin is warm. No rash noted.  Psychiatric: She has a normal mood and affect. Her speech is normal and behavior is normal. Judgment and thought content normal. Cognition and memory are normal.     Assessment & Plan  1.  CPE Pap sent Mammogram ordered Influenza vaccine Guaiac cards x 3 to return in 2 weeks. CBC, CMP  2.  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 Viral culture of fluid from vesicular genital lesion. Acyclovir 400 mg 3 times daily for 7 days, then if culture returns as expected, to start twice daily dosing indefinitely for suppression GC/Chlamydia  3.  Prediabetes/dyslipidemia/obesity:  Encouraged continued work on lifestyle changes  4.  Mild thyromegaly:  TSH

## 2019-08-25 LAB — SPECIMEN STATUS

## 2019-08-26 LAB — COMPREHENSIVE METABOLIC PANEL
ALT: 19 IU/L (ref 0–32)
AST: 21 IU/L (ref 0–40)
Albumin/Globulin Ratio: 1.1 — ABNORMAL LOW (ref 1.2–2.2)
Albumin: 4.2 g/dL (ref 3.8–4.8)
Alkaline Phosphatase: 76 IU/L (ref 39–117)
BUN/Creatinine Ratio: 17 (ref 9–23)
BUN: 13 mg/dL (ref 6–24)
Bilirubin Total: 0.2 mg/dL (ref 0.0–1.2)
CO2: 21 mmol/L (ref 20–29)
Calcium: 9.4 mg/dL (ref 8.7–10.2)
Chloride: 99 mmol/L (ref 96–106)
Creatinine, Ser: 0.75 mg/dL (ref 0.57–1.00)
GFR calc Af Amer: 113 mL/min/{1.73_m2} (ref >59)
GFR calc non Af Amer: 98 mL/min/{1.73_m2} (ref >59)
Globulin, Total: 3.7 g/dL (ref 1.5–4.5)
Glucose: 103 mg/dL — ABNORMAL HIGH (ref 65–99)
Potassium: 4.6 mmol/L (ref 3.5–5.2)
Sodium: 135 mmol/L (ref 134–144)
Total Protein: 7.9 g/dL (ref 6.0–8.5)

## 2019-08-26 LAB — LIPID PANEL W/O CHOL/HDL RATIO
Cholesterol, Total: 170 mg/dL (ref 100–199)
HDL: 38 mg/dL — ABNORMAL LOW (ref >39)
LDL Chol Calc (NIH): 114 mg/dL — ABNORMAL HIGH (ref 0–99)
Triglycerides: 96 mg/dL (ref 0–149)
VLDL Cholesterol Cal: 18 mg/dL (ref 5–40)

## 2019-08-26 LAB — CBC WITH DIFFERENTIAL/PLATELET
Basophils Absolute: 0.1 10*3/uL (ref 0.0–0.2)
Basos: 1 %
EOS (ABSOLUTE): 0.1 10*3/uL (ref 0.0–0.4)
Eos: 1 %
Hematocrit: 37.8 % (ref 34.0–46.6)
Hemoglobin: 12.1 g/dL (ref 11.1–15.9)
Immature Grans (Abs): 0 10*3/uL (ref 0.0–0.1)
Immature Granulocytes: 0 %
Lymphocytes Absolute: 2.1 10*3/uL (ref 0.7–3.1)
Lymphs: 33 %
MCH: 24.5 pg — ABNORMAL LOW (ref 26.6–33.0)
MCHC: 32 g/dL (ref 31.5–35.7)
MCV: 77 fL — ABNORMAL LOW (ref 79–97)
Monocytes Absolute: 0.4 10*3/uL (ref 0.1–0.9)
Monocytes: 6 %
Neutrophils Absolute: 3.9 10*3/uL (ref 1.4–7.0)
Neutrophils: 59 %
Platelets: 369 10*3/uL (ref 150–450)
RBC: 4.94 x10E6/uL (ref 3.77–5.28)
RDW: 14 % (ref 11.7–15.4)
WBC: 6.5 10*3/uL (ref 3.4–10.8)

## 2019-08-26 LAB — RPR QUALITATIVE: RPR Ser Ql: NONREACTIVE

## 2019-08-26 LAB — SPECIMEN STATUS REPORT

## 2019-08-26 LAB — HGB A1C W/O EAG: Hgb A1c MFr Bld: 6.1 % — ABNORMAL HIGH (ref 4.8–5.6)

## 2019-08-26 LAB — TSH: TSH: 3.77 u[IU]/mL (ref 0.450–4.500)

## 2019-08-27 LAB — GC/CHLAMYDIA PROBE AMP
Chlamydia trachomatis, NAA: NEGATIVE
Neisseria Gonorrhoeae by PCR: NEGATIVE

## 2019-08-27 LAB — HERPES SIMPLEX VIRUS CULTURE

## 2019-08-28 LAB — CYTOLOGY - PAP

## 2019-12-15 ENCOUNTER — Ambulatory Visit: Payer: Self-pay

## 2020-02-01 ENCOUNTER — Other Ambulatory Visit: Payer: Self-pay

## 2020-02-01 ENCOUNTER — Ambulatory Visit
Admission: RE | Admit: 2020-02-01 | Discharge: 2020-02-01 | Disposition: A | Discharge status: Home / Self Care | Payer: No Typology Code available for payment source | Source: Ambulatory Visit | Attending: Internal Medicine | Admitting: Internal Medicine

## 2020-02-01 DIAGNOSIS — Z1231 Encounter for screening mammogram for malignant neoplasm of breast: Secondary | ICD-10-CM

## 2020-02-09 IMAGING — MG DIGITAL DIAGNOSTIC BILATERAL MAMMOGRAM WITH TOMO AND CAD
8 series · 8 of 24 positions shown · non-contrast
Comparison: Previous exam(s).

CLINICAL DATA: 42-year-old female presenting for annual bilateral
mammogram and final 2 year follow-up of a probably benign right
breast mass.

EXAM:
DIGITAL DIAGNOSTIC BILATERAL MAMMOGRAM WITH CAD AND TOMO
ULTRASOUND RIGHT BREAST

[R MLO synth-2D]
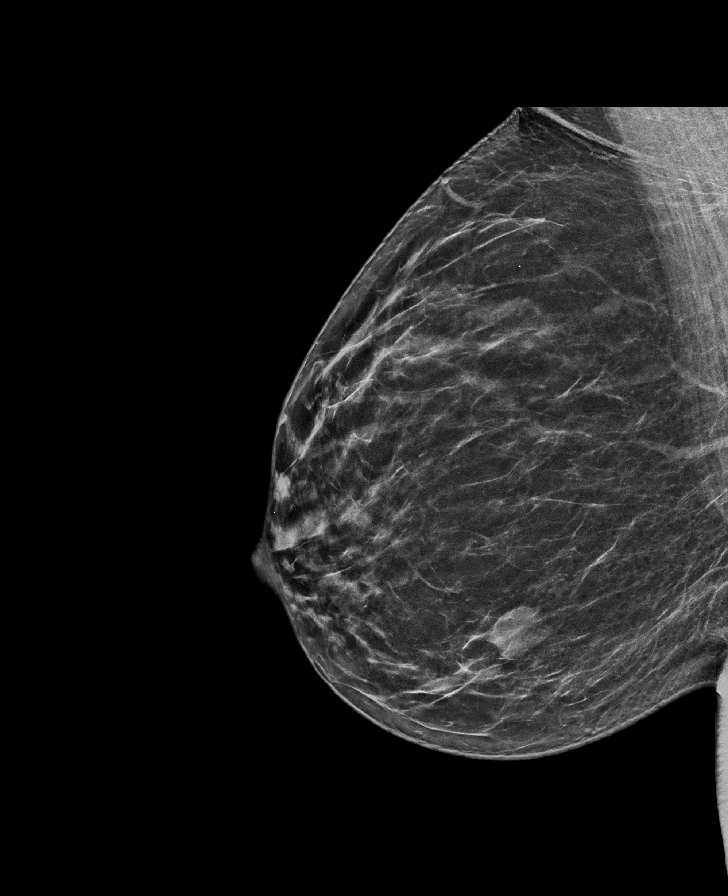

[L MLO synth-2D]
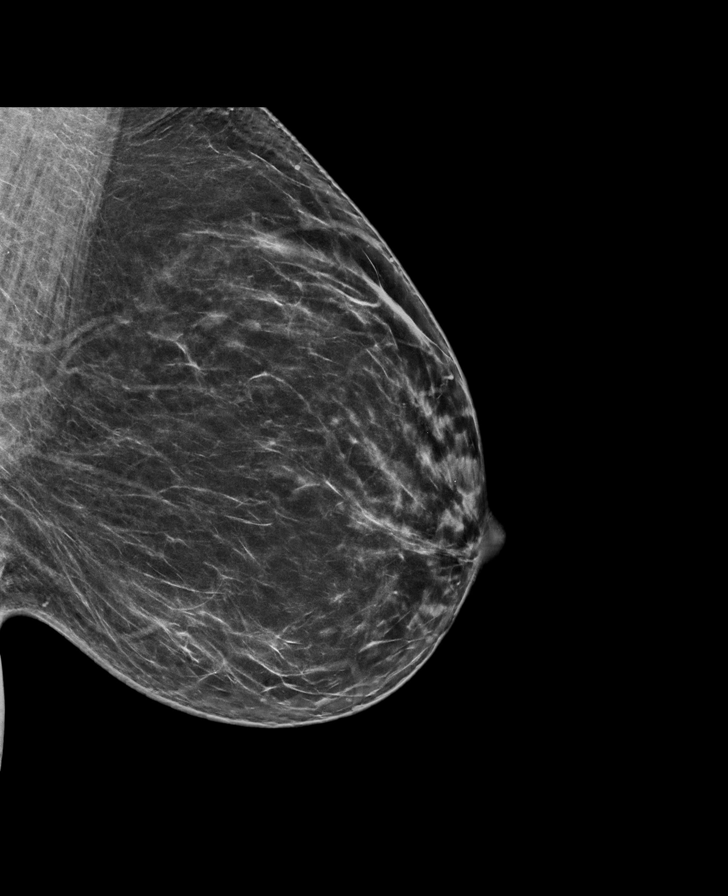

[L CC synth-2D]
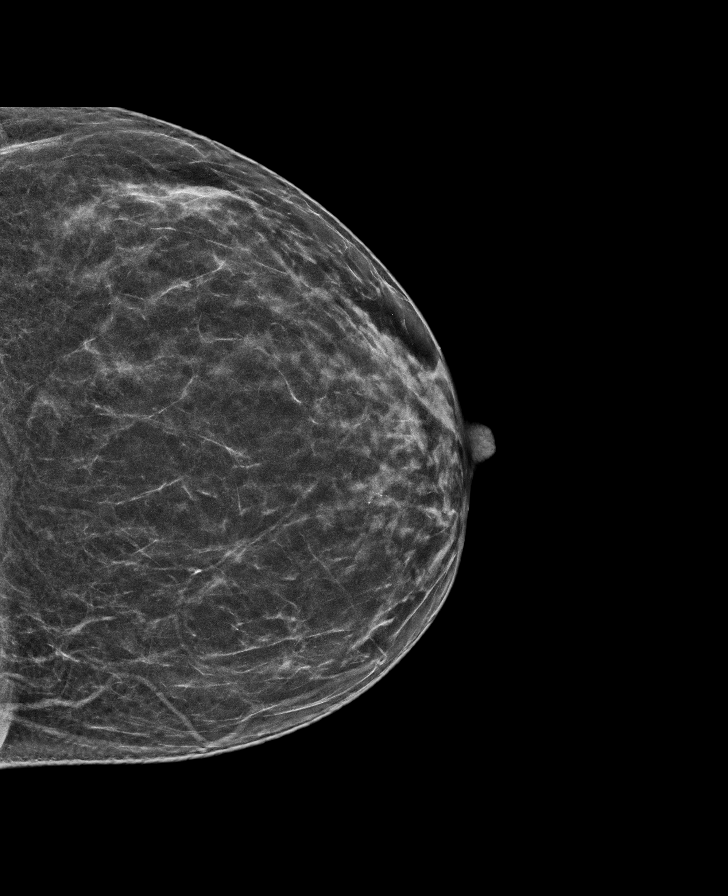

[R CC synth-2D]
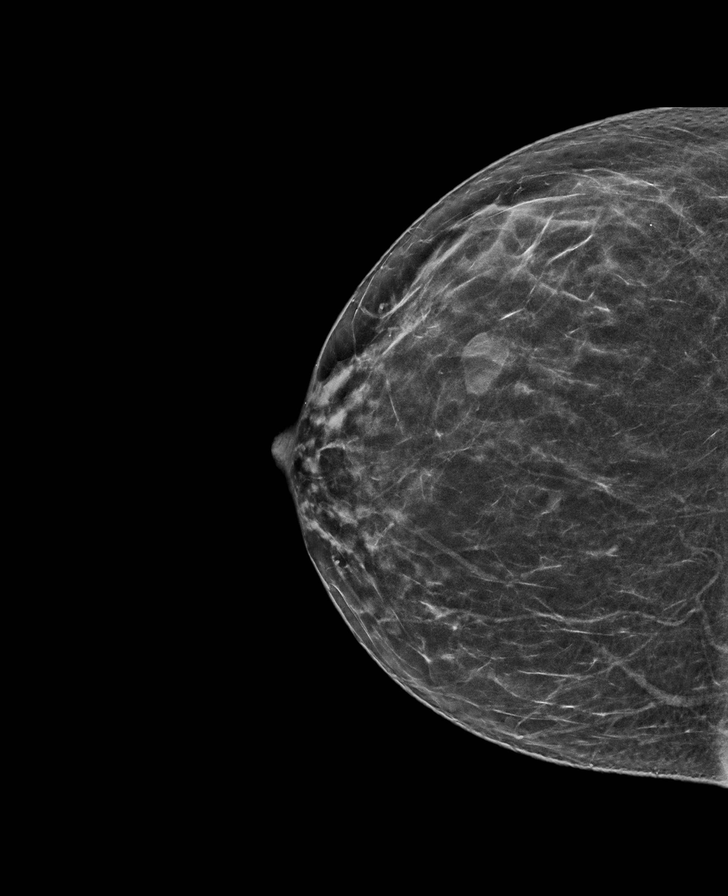

[L CC tomo · tomo slice 33/65.0]
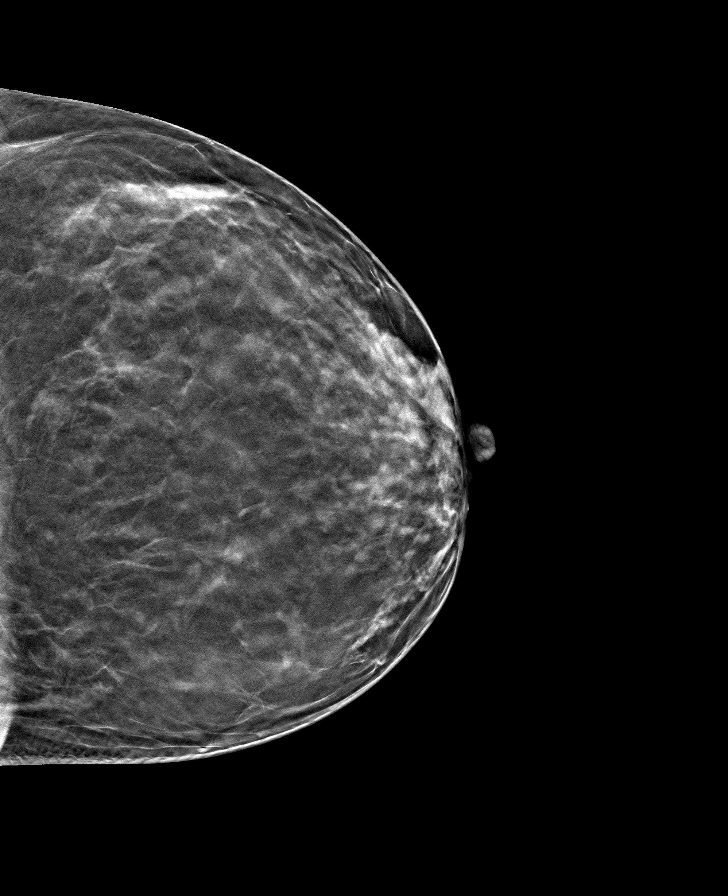

[R MLO tomo · tomo slice 37/72.0]
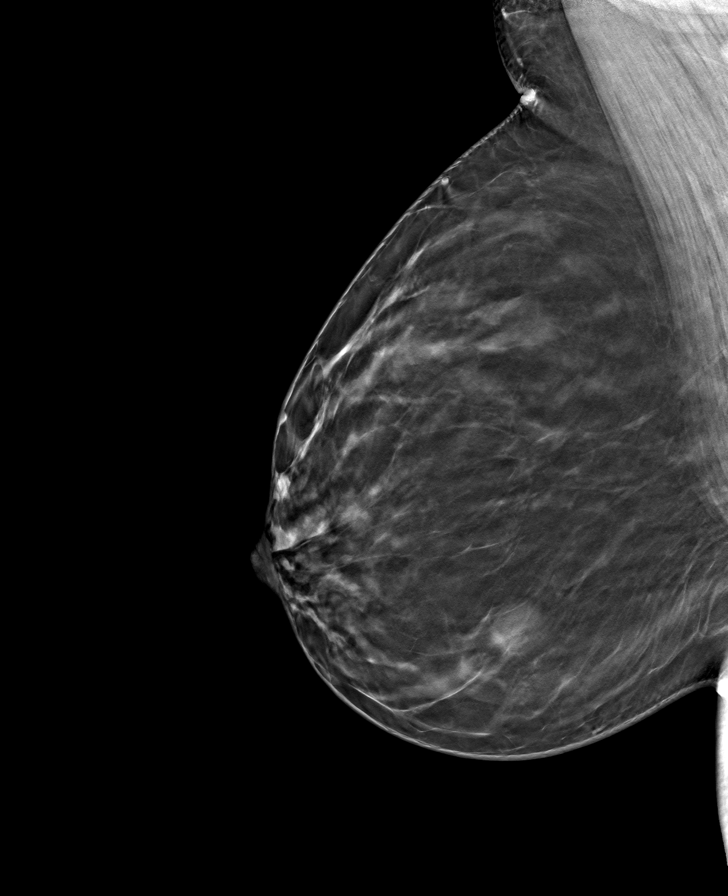

[L MLO tomo · tomo slice 39/78.0]
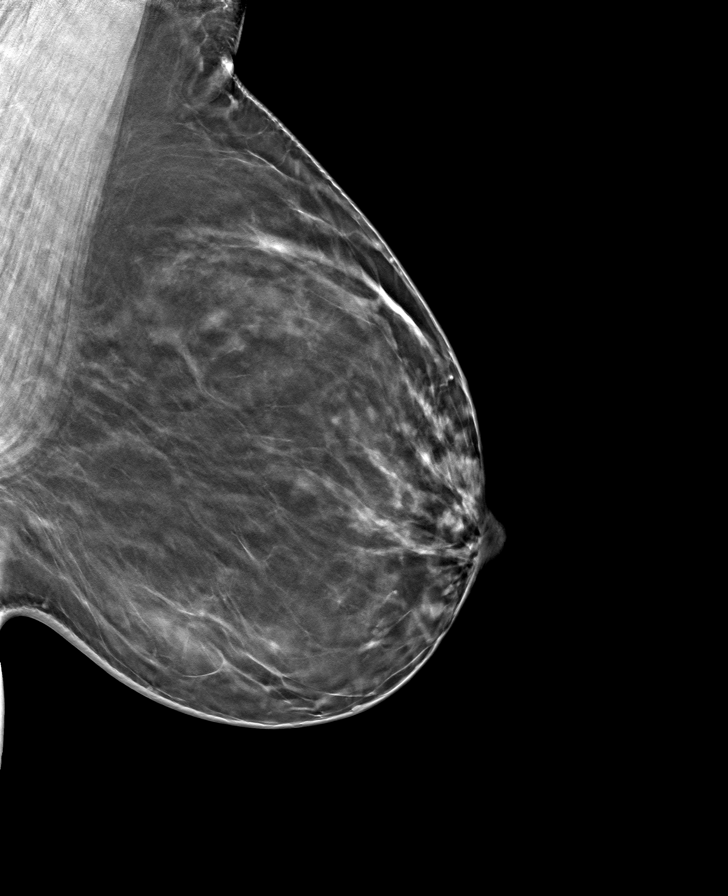

[R CC tomo · tomo slice 33/64.0]
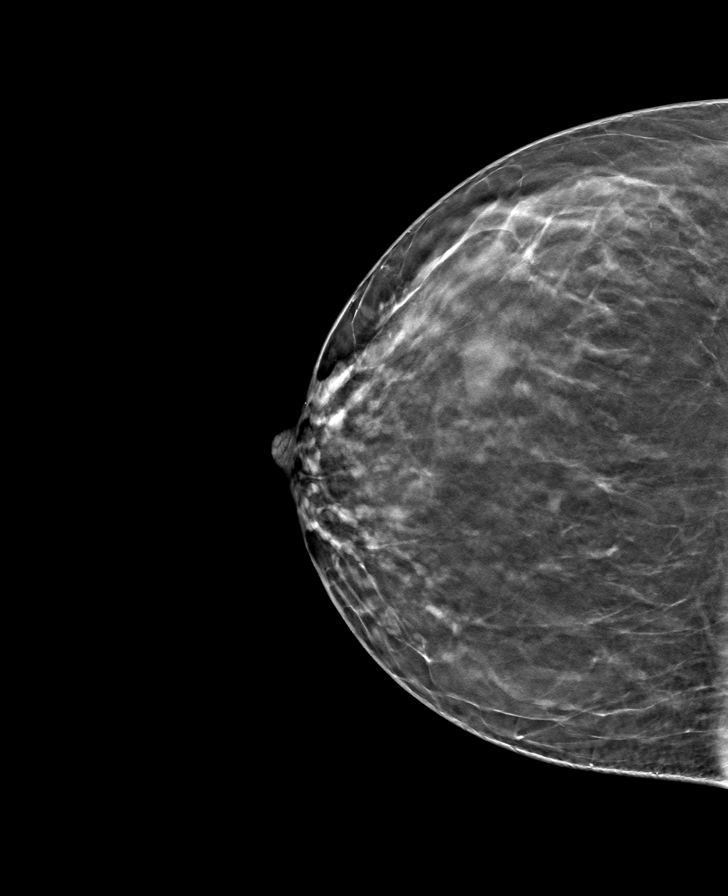

[8 of 24 positions shown; findings below may reference images not displayed]

ACR Breast Density Category b: There are scattered areas of
fibroglandular density.
FINDINGS: An oval, circumscribed equal density mass in the lower outer right
breast at middle depth is mammographically stable. No new or
suspicious findings are identified in either breast. The parenchymal
pattern is stable.

Mammographic images were processed with CAD.

Targeted ultrasound is performed, showing stable appearance of an
oval, circumscribed hypoechoic mass at the 8 o'clock position 6 cm
from the nipple. It measures 1.9 x 1.7 x 0.6 cm (1.9 x 1.6 x
cm). Slight variations are likely due to differences in positioning
and technique.
IMPRESSION: 1. Benign right breast mass demonstrating greater than 2 year
stability. No further imaging follow-up required.
2. No mammographic evidence of malignancy in either breast.

RECOMMENDATION:
Screening mammogram in one year.(Code:SE-5-VJU)

I have discussed the findings and recommendations with the patient.
Results were also provided in writing at the conclusion of the
visit. If applicable, a reminder letter will be sent to the patient
regarding the next appointment.

BI-RADS CATEGORY  2: Benign.

## 2021-06-17 ENCOUNTER — Other Ambulatory Visit: Payer: Self-pay

## 2021-06-17 DIAGNOSIS — Z1231 Encounter for screening mammogram for malignant neoplasm of breast: Secondary | ICD-10-CM

## 2021-07-01 ENCOUNTER — Other Ambulatory Visit: Payer: Self-pay | Admitting: Obstetrics and Gynecology

## 2021-07-01 DIAGNOSIS — Z1231 Encounter for screening mammogram for malignant neoplasm of breast: Secondary | ICD-10-CM

## 2021-07-31 ENCOUNTER — Ambulatory Visit: Payer: No Typology Code available for payment source

## 2021-08-14 ENCOUNTER — Ambulatory Visit: Payer: No Typology Code available for payment source

## 2021-08-21 ENCOUNTER — Encounter (INDEPENDENT_AMBULATORY_CARE_PROVIDER_SITE_OTHER): Payer: Self-pay

## 2021-08-21 ENCOUNTER — Ambulatory Visit
Admission: RE | Admit: 2021-08-21 | Discharge: 2021-08-21 | Disposition: A | Discharge status: Home / Self Care | Payer: No Typology Code available for payment source | Source: Ambulatory Visit | Attending: Obstetrics and Gynecology | Admitting: Obstetrics and Gynecology

## 2021-08-21 ENCOUNTER — Other Ambulatory Visit: Payer: Self-pay

## 2021-08-21 ENCOUNTER — Ambulatory Visit: Payer: Self-pay | Admitting: *Deleted

## 2021-08-21 VITALS — BP 122/80 | Wt 182.0 lb

## 2021-08-21 DIAGNOSIS — Z1239 Encounter for other screening for malignant neoplasm of breast: Secondary | ICD-10-CM

## 2021-08-21 DIAGNOSIS — Z1231 Encounter for screening mammogram for malignant neoplasm of breast: Secondary | ICD-10-CM

## 2021-08-21 NOTE — Progress Notes (Signed)
[name] is a 45 y.o. female who presents to [facility] clinic today with no complaints.    Pap Smear: Pap smear not completed today. Last Pap smear was 08/24/2019 at [facility] and normal. Per patient has no history of an abnormal Pap smear. Last Pap smear result is available in EPIC.   Physical exam: Breasts Breasts symmetrical. No skin abnormalities bilateral breasts. No nipple retraction bilateral breasts. No nipple discharge bilateral breasts. No lymphadenopathy. No lumps palpated bilateral breasts. No complaints of pain or tenderness on exam.  MS DIGITAL SCREENING BILATERAL  Result Date: 08/25/2016 CLINICAL DATA:  Screening. Baseline screening mammogram. EXAM: DIGITAL SCREENING BILATERAL MAMMOGRAM WITH CAD COMPARISON:  None ACR Breast Density Category b: There are scattered areas of fibroglandular density. FINDINGS: In the right breast, a possible mass warrants further evaluation. In the left breast, no findings suspicious for malignancy. Images were processed with CAD. IMPRESSION: Further evaluation is suggested for possible mass in the right breast. RECOMMENDATION: Diagnostic mammogram and possibly ultrasound of the right breast. (Code:[id number]-57M) The patient will be contacted regarding the findings, and additional imaging will be scheduled. BI-RADS CATEGORY  0: Incomplete. Need additional imaging evaluation and/or prior mammograms for comparison. Electronically Signed   By: Franki Cabot M.D.   On: 08/26/2016 13:01   MM DIAG BREAST TOMO UNI RIGHT  Result Date: 09/24/2016 CLINICAL DATA:  Possible mass right breast identified on recent baseline screening mammogram performed August 25, 2016. EXAM: 2D DIGITAL DIAGNOSTIC RIGHT MAMMOGRAM WITH CAD AND ADJUNCT TOMO ULTRASOUND RIGHT BREAST COMPARISON:  08/25/2016 ACR Breast Density Category b: There are scattered areas of fibroglandular density. FINDINGS: Whole breast CC and MLO views including tomography confirm an oval  circumscribed mass measuring approximately 1.8 cm greatest diameter. Mammographic images were processed with CAD. On physical exam, no definite mass is palpated in the lower outer quadrant of the right breast. Targeted ultrasound is performed, showing an oval macrolobulated hypoechoic mass oriented parallel to the chest wall at 8 o'clock position 6 cm from the nipple. This mass measures 1.8 x 0.7 x 1.5 cm and corresponds to the mammographically detected mass. Sonographic features suggest a benign fibroadenoma. 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 RECOMMENDATION: Options include six-month follow-up right breast ultrasound versus ultrasound-guided core needle biopsy. A right breast ultrasound is suggested in 6 months. I have discussed the findings and recommendations with the patient. Results were also provided in writing at the conclusion of the visit. If applicable, a reminder letter will be sent to the patient regarding the next appointment. BI-RADS CATEGORY  3: Probably benign. Electronically Signed   By: Curlene Dolphin M.D.   On: 09/24/2016 14:36   MM DIAG BREAST TOMO BILATERAL  Result Date: 09/28/2017 CLINICAL DATA:  One year interval follow-up of a likely benign fibroadenoma in the lower outer quadrant of the right breast at middle depth, initially identified on baseline screening mammography. Annual evaluation, left breast. EXAM: 2D DIGITAL DIAGNOSTIC BILATERAL MAMMOGRAM WITH CAD AND ADJUNCT TOMO ULTRASOUND RIGHT BREAST COMPARISON:  Mammography 09/24/2016 (right), 08/25/2016 (bilateral, baseline). Right breast ultrasound 05/27/2017, 09/24/2016. ACR Breast Density Category b: There are scattered areas of fibroglandular density. FINDINGS: Standard 2D and tomosynthesis full field CC and MLO views of both breasts were obtained. The circumscribed low-density mass in the lower outer quadrant of the right breast at middle depth which measures approximately 2 cm on the tomosynthesis images is unchanged.  No new or suspicious findings elsewhere in the right breast. No findings suspicious for malignancy in the left  breast. Mammographic images were processed with CAD. Targeted right breast ultrasound is performed, showing the previously identified oval circumscribed parallel mass at the 8 o'clock position approximately 6 cm from the nipple measuring approximately 0.7 x 1.6 x 1.9 cm (previously 0.7 x 1.6 x 1.9 cm on 05/27/2017 and 0.7 x 1.5 x 1.8 cm on 09/24/2016). No new suspicious solid mass or abnormal acoustic shadowing is identified. IMPRESSION: 1. 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 2. No mammographic evidence of malignancy involving the left breast. RECOMMENDATION: Right breast ultrasound at the time of bilateral diagnostic mammography in 1 year in order to confirm 2 years of stability of the likely benign right breast fibroadenoma. I have discussed the findings and recommendations with the patient. Communication with the patient was achieved with the assistance of a certified interpreter. Results were also provided in writing at the conclusion of the visit. If applicable, a reminder letter will be sent to the patient regarding the next appointment. BI-RADS CATEGORY  3: Probably benign. Electronically Signed   By: Evangeline Dakin M.D.   On: 09/28/2017 16:20   MS DIGITAL SCREENING TOMO BILATERAL  Result Date: 02/01/2020 CLINICAL DATA:  Screening. EXAM: DIGITAL SCREENING BILATERAL MAMMOGRAM WITH TOMO AND CAD COMPARISON:  Previous exam(s). ACR Breast Density Category b: There are scattered areas of fibroglandular density. FINDINGS: There are no findings suspicious for malignancy. Images were processed with CAD. IMPRESSION: No mammographic evidence of malignancy. A result letter of this screening mammogram will be mailed directly to the patient. RECOMMENDATION: Screening mammogram in one year. (Code:[id number]-01Y) BI-RADS CATEGORY  1: Negative.  Electronically Signed   By: Abelardo Diesel M.D.   On: 02/01/2020 14:57   MS DIGITAL DIAG TOMO BILAT  Result Date: 12/13/2018 CLINICAL DATA:  45 year old female presenting for annual bilateral mammogram and final 2 year follow-up of a probably benign right breast mass. EXAM: DIGITAL DIAGNOSTIC BILATERAL MAMMOGRAM WITH CAD AND TOMO ULTRASOUND RIGHT BREAST COMPARISON:  Previous exam(s). ACR Breast Density Category b: There are scattered areas of fibroglandular density. FINDINGS: An oval, circumscribed equal density mass in the lower outer right breast at middle depth is mammographically stable. No new or suspicious findings are identified in either breast. The parenchymal pattern is stable. Mammographic images were processed with CAD. Targeted ultrasound is performed, showing stable appearance of an oval, circumscribed hypoechoic mass at the 8 o'clock position 6 cm from the nipple. It measures 1.9 x 1.7 x 0.6 cm (1.9 x 1.6 x 0.7 cm). Slight variations are likely due to differences in positioning and technique. IMPRESSION: 1. Benign right breast mass demonstrating greater than 2 year stability. No further imaging follow-up required. 2. No mammographic evidence of malignancy in either breast. RECOMMENDATION: Screening mammogram in one year.(Code:[id number]-B-01Y) I have discussed the findings and recommendations with the patient. Results were also provided in writing at the conclusion of the visit. If applicable, a reminder letter will be sent to the patient regarding the next appointment. BI-RADS CATEGORY  2: Benign. Electronically Signed   By: Kristopher Oppenheim M.D.   On: 12/13/2018 14:12        Pelvic/Bimanual Pap is not indicated today per BCCCP guidelines.   Smoking History: Patient has never smoked.   Patient Navigation: Patient education provided. Access to services provided for patient through [place] program. Spanish interpreter Edwinna Areola from [facility] provided.   Colorectal Cancer Screening: Per patient has  never had a colonoscopy completed. Patient completed a FIT Test 09/29/2018  that was negative. Patient refused FIT Test today and stated she would complete at her PCP's office.  No complaints today.    Breast and Cervical Cancer Risk Assessment: Patient does not have family history of breast cancer, known genetic mutations, or radiation treatment to the chest before age 50. Patient does not have history of cervical dysplasia, immunocompromised, or DES exposure in-utero.  Risk Assessment     Risk Scores       08/21/2021 12/13/2018   Last edited by: Demetrius Revel, LPN Katrina Richardson, Heath Gold, RN   5-year risk: 0.4 % 0.3 %   Lifetime risk: 4.5 % 4.6 %            A: BCCCP exam without pap smear No complaints.  P: Referred patient to the [facility] for a screening mammogram on mobile unit. Appointment scheduled Thursday, August 21, 2021 at 1130.  Loletta Parish, RN 08/21/2021 11:10 AM

## 2021-08-21 NOTE — Patient Instructions (Signed)
Explained breast self awareness with Katrina Richardson. Patient did not need a Pap smear today due to last Pap smear was 08/24/2019. Let her know BCCCP will cover Pap smears every 3 years unless has a history of abnormal Pap smears. Referred patient to the [facility] for a screening mammogram on mobile unit. Appointment scheduled Thursday, August 21, 2021 at 1130. Patient escorted to the mobile unit following BCCCP appointment for her screening mammogram. Let [facility] know [facility] will [facility] up with her within the next couple weeks with results of her mammogram by letter or phone. Katrina Richardson verbalized understanding.  [name], Arvil Chaco, RN 11:10 AM

## 2022-07-30 ENCOUNTER — Other Ambulatory Visit: Payer: Self-pay | Admitting: Hematology and Oncology

## 2022-07-30 DIAGNOSIS — Z1231 Encounter for screening mammogram for malignant neoplasm of breast: Secondary | ICD-10-CM

## 2022-10-01 ENCOUNTER — Ambulatory Visit
Admission: RE | Admit: 2022-10-01 | Discharge: 2022-10-01 | Disposition: A | Discharge status: Home / Self Care | Payer: No Typology Code available for payment source | Source: Ambulatory Visit | Attending: Internal Medicine | Admitting: Internal Medicine

## 2022-10-01 ENCOUNTER — Ambulatory Visit: Payer: Self-pay | Admitting: Hematology and Oncology

## 2022-10-01 VITALS — BP 118/86 | Wt 184.5 lb

## 2022-10-01 DIAGNOSIS — Z1231 Encounter for screening mammogram for malignant neoplasm of breast: Secondary | ICD-10-CM

## 2022-10-01 DIAGNOSIS — Z1211 Encounter for screening for malignant neoplasm of colon: Secondary | ICD-10-CM

## 2022-10-01 DIAGNOSIS — Z01419 Encounter for gynecological examination (general) (routine) without abnormal findings: Secondary | ICD-10-CM

## 2022-10-01 NOTE — Progress Notes (Addendum)
Katrina Richardson is a 46 y.o. G3P0 female who presents to [facility] clinic today with no complaints.    Pap Smear: Pap smear completed today. Last Pap smear was 2020  and was normal. Per patient has no history of an abnormal Pap smear. Last Pap smear result is available in Epic.   Physical exam: Breasts Breasts symmetrical. No skin abnormalities bilateral breasts. No nipple retraction bilateral breasts. No nipple discharge bilateral breasts. No lymphadenopathy. No lumps palpated bilateral breasts.     MM 3D SCREEN BREAST BILATERAL  Result Date: 08/24/2021 CLINICAL DATA:  Screening. EXAM: DIGITAL SCREENING BILATERAL MAMMOGRAM WITH TOMOSYNTHESIS AND CAD TECHNIQUE: Bilateral screening digital craniocaudal and mediolateral oblique mammograms were obtained. Bilateral screening digital breast tomosynthesis was performed. The images were evaluated with computer-aided detection. COMPARISON:  Previous exam(s). ACR Breast Density Category b: There are scattered areas of fibroglandular density. FINDINGS: There are no findings suspicious for malignancy. IMPRESSION: No mammographic evidence of malignancy. A result letter of this screening mammogram will be mailed directly to the patient. RECOMMENDATION: Screening mammogram in one year. (Code:SM-B-01Y) BI-RADS CATEGORY  1: Negative. Electronically Signed   By: Ammie Ferrier M.D.   On: 08/24/2021 09:29   MS DIGITAL SCREENING TOMO BILATERAL  Result Date: 02/01/2020 CLINICAL DATA:  Screening. EXAM: DIGITAL SCREENING BILATERAL MAMMOGRAM WITH TOMO AND CAD COMPARISON:  Previous exam(s). ACR Breast Density Category b: There are scattered areas of fibroglandular density. FINDINGS: There are no findings suspicious for malignancy. Images were processed with CAD. IMPRESSION: No mammographic evidence of malignancy. A result letter of this screening mammogram will be mailed directly to the patient. RECOMMENDATION: Screening mammogram in one year. (Code:SM-B-01Y)  BI-RADS CATEGORY  1: Negative. Electronically Signed   By: Abelardo Diesel M.D.   On: 02/01/2020 14:57   MS DIGITAL DIAG TOMO BILAT  Result Date: 12/13/2018 CLINICAL DATA:  46 year old female presenting for annual bilateral mammogram and final 2 year follow-up of a probably benign right breast mass. EXAM: DIGITAL DIAGNOSTIC BILATERAL MAMMOGRAM WITH CAD AND TOMO ULTRASOUND RIGHT BREAST COMPARISON:  Previous exam(s). ACR Breast Density Category b: There are scattered areas of fibroglandular density. FINDINGS: An oval, circumscribed equal density mass in the lower outer right breast at middle depth is mammographically stable. No new or suspicious findings are identified in either breast. The parenchymal pattern is stable. Mammographic images were processed with CAD. Targeted ultrasound is performed, showing stable appearance of an oval, circumscribed hypoechoic mass at the 8 o'clock position 6 cm from the nipple. It measures 1.9 x 1.7 x 0.6 cm (1.9 x 1.6 x 0.7 cm). Slight variations are likely due to differences in positioning and technique. IMPRESSION: 1. Benign right breast mass demonstrating greater than 2 year stability. No further imaging follow-up required. 2. No mammographic evidence of malignancy in either breast. RECOMMENDATION: Screening mammogram in one year.(Code:SM-B-01Y) I have discussed the findings and recommendations with the patient. Results were also provided in writing at the conclusion of the visit. If applicable, a reminder letter will be sent to the patient regarding the next appointment. BI-RADS CATEGORY  2: Benign. Electronically Signed   By: Kristopher Oppenheim M.D.   On: 12/13/2018 14:12      Pelvic/Bimanual Ext Genitalia No lesions, no swelling and no discharge observed on external genitalia.        Vagina Vagina pink and normal texture. No lesions or discharge observed in vagina.        Cervix Cervix is present. Cervix pink and of normal texture. No discharge observed.  Uterus Uterus is present and palpable. Uterus in normal position and normal size.        Adnexae Bilateral ovaries present and palpable. No tenderness on palpation.         Rectovaginal No rectal exam completed today since patient had no rectal complaints. No skin abnormalities observed on exam.     Smoking History: Patient has never smoked and was not referred to quit line.    Patient Navigation: Patient education provided. Access to services provided for patient through [place] interpreter provided. No transportation provided   Colorectal Cancer Screening: Per patient has never had colonoscopy completed No complaints today. FIT test will be mailed.   Breast and Cervical Cancer Risk Assessment: Patient does not have family history of breast cancer, known genetic mutations, or radiation treatment to the chest before age 8. Patient does not have history of cervical dysplasia, immunocompromised, or DES exposure in-utero.  Risk Scores as of 10/01/2022     Katrina Richardson           5-year 0.56 %   Lifetime 6.31 %   This patient is [place] but has no documented birth country, so the [place] used data from [place] patients to calculate their risk score. Document a birth country in the Demographics activity for a more accurate score.         Last calculated by Claretha Cooper, CMA on 10/01/2022 at 10:46 AM        A: BCCCP exam with pap smear No complaint with benign exam.  P: Referred patient to the [facility] for a screening mammogram. Appointment scheduled 10/02/22.  Dayton Scrape A, NP 10/01/2022 11:18 AM

## 2022-10-01 NOTE — Patient Instructions (Signed)
Taught Katrina Richardson about self breast awareness and gave educational materials to take home. Patient did need a Pap smear today due to last Pap smear was in 2020 per patient. Let her know B[facility]CP will cover Pap smears every 5 years unless has a history of abnormal Pap smears. Referred patient to the [facility] for screening mammogram. Appointment scheduled for 10/01/22. Patient aware of appointment and will be there. Let patient know will follow up with her within the next couple weeks with results. [name] verbalized understanding. Next Pap smear in 2028.  Melodye Ped, NP 11:22 AM

## 2022-10-07 LAB — CYTOLOGY - PAP
Adequacy: ABSENT
Comment: NEGATIVE
Diagnosis: NEGATIVE
High risk HPV: NEGATIVE

## 2022-10-13 NOTE — Progress Notes (Signed)
FIT test has been mailed to the pt 10/13/2022.

## 2022-10-15 ENCOUNTER — Telehealth: Payer: Self-pay

## 2022-10-15 NOTE — Telephone Encounter (Signed)
A letter indicating her results and follow-up instructions has also been mailed to her with [facility] translation.

## 2022-10-15 NOTE — Telephone Encounter (Signed)
Several attempts have been made to reach the pt to review her PAP results. We have also contact her friend, Marcello Fennel, who advised she will have the pt contact us 10/15/22. Pt has not returned our call thus far.  Should pt choose to return our calls, her PAP/HPV were negative and she will need to repeat them in 5 years.

## 2022-10-28 ENCOUNTER — Telehealth: Payer: Self-pay

## 2022-10-28 NOTE — Telephone Encounter (Signed)
Via Lavon Paganini, Spanish Interpreter, Patient informed negative Pap/HPV results, next pap due in 5 years.

## 2023-09-16 ENCOUNTER — Other Ambulatory Visit: Payer: Self-pay | Admitting: Obstetrics and Gynecology

## 2023-09-16 DIAGNOSIS — Z1231 Encounter for screening mammogram for malignant neoplasm of breast: Secondary | ICD-10-CM

## 2023-11-25 ENCOUNTER — Ambulatory Visit: Payer: Self-pay | Admitting: *Deleted

## 2023-11-25 ENCOUNTER — Ambulatory Visit
Admission: RE | Admit: 2023-11-25 | Discharge: 2023-11-25 | Disposition: A | Discharge status: Home / Self Care | Payer: No Typology Code available for payment source | Source: Ambulatory Visit | Attending: Obstetrics and Gynecology | Admitting: Obstetrics and Gynecology

## 2023-11-25 VITALS — BP 120/80 | Wt 183.0 lb

## 2023-11-25 DIAGNOSIS — Z1239 Encounter for other screening for malignant neoplasm of breast: Secondary | ICD-10-CM

## 2023-11-25 DIAGNOSIS — Z1211 Encounter for screening for malignant neoplasm of colon: Secondary | ICD-10-CM

## 2023-11-25 DIAGNOSIS — Z1231 Encounter for screening mammogram for malignant neoplasm of breast: Secondary | ICD-10-CM

## 2023-11-25 NOTE — Patient Instructions (Signed)
Explained breast self awareness with Katrina Richardson. Patient did not need a Pap smear today due to last Pap smear and HPV typing was 10/01/2022. Let her know BCCCP will cover Pap smears and HPV typing every 5 years unless has a history of abnormal Pap smears. Referred patient to the [facility] for a screening mammogram on mobile unit. Appointment scheduled Thursday, November 25, 2023 at 1530. Patient aware of appointment and will be there. Let patient know the Breast Center will follow up with her within the next couple weeks with results of mammogram by letter or phone. [name] verbalized understanding.  Venola Castello, Kathaleen Maser, RN 3:14 PM

## 2023-11-25 NOTE — Progress Notes (Signed)
[name] is a 48 y.o. female who presents to [facility] clinic today with no complaints.    Pap Smear: Pap smear not completed today. Last Pap smear was 10/01/2022 at [facility] clinic and was normal with negative HPV. Per patient has no history of an abnormal Pap smear. Last Pap smear result is available in Epic.   Physical exam: Breasts Breasts symmetrical. No skin abnormalities bilateral breasts. No nipple retraction bilateral breasts. No nipple discharge bilateral breasts. No lymphadenopathy. No lumps palpated bilateral breasts. No complaints of pain or tenderness on exam.   MS DIGITAL SCREENING BILATERAL Result Date: 10/02/2022 CLINICAL DATA:  Screening. EXAM: DIGITAL SCREENING BILATERAL MAMMOGRAM WITH CAD TECHNIQUE: Bilateral screening digital craniocaudal and mediolateral oblique mammograms were obtained. The images were evaluated with computer-aided detection. COMPARISON:  Previous exam(s). ACR Breast Density Category b: There are scattered areas of fibroglandular density. FINDINGS: There are no findings suspicious for malignancy. IMPRESSION: No mammographic evidence of malignancy. A result letter of this screening mammogram will be mailed directly to the patient. RECOMMENDATION: Screening mammogram in one year. (Code:SM-B-01Y) BI-RADS CATEGORY  1: Negative. Electronically Signed   By: Sande Brothers M.D.   On: 10/02/2022 11:38   MM 3D SCREEN BREAST BILATERAL Result Date: 08/24/2021 CLINICAL DATA:  Screening. EXAM: DIGITAL SCREENING BILATERAL MAMMOGRAM WITH TOMOSYNTHESIS AND CAD TECHNIQUE: Bilateral screening digital craniocaudal and mediolateral oblique mammograms were obtained. Bilateral screening digital breast tomosynthesis was performed. The images were evaluated with computer-aided detection. COMPARISON:  Previous exam(s). ACR Breast Density Category b: There are scattered areas of fibroglandular density. FINDINGS: There are no findings suspicious for malignancy. IMPRESSION: No  mammographic evidence of malignancy. A result letter of this screening mammogram will be mailed directly to the patient. RECOMMENDATION: Screening mammogram in one year. (Code:SM-B-01Y) BI-RADS CATEGORY  1: Negative. Electronically Signed   By: Frederico Hamman M.D.   On: 08/24/2021 09:29   MS DIGITAL SCREENING TOMO BILATERAL Result Date: 02/01/2020 CLINICAL DATA:  Screening. EXAM: DIGITAL SCREENING BILATERAL MAMMOGRAM WITH TOMO AND CAD COMPARISON:  Previous exam(s). ACR Breast Density Category b: There are scattered areas of fibroglandular density. FINDINGS: There are no findings suspicious for malignancy. Images were processed with CAD. IMPRESSION: No mammographic evidence of malignancy. A result letter of this screening mammogram will be mailed directly to the patient. RECOMMENDATION: Screening mammogram in one year. (Code:SM-B-01Y) BI-RADS CATEGORY  1: Negative. Electronically Signed   By: Sherian Rein M.D.   On: 02/01/2020 14:57   MS DIGITAL DIAG TOMO BILAT Result Date: 12/13/2018 CLINICAL DATA:  48 year old female presenting for annual bilateral mammogram and final 2 year follow-up of a probably benign right breast mass. EXAM: DIGITAL DIAGNOSTIC BILATERAL MAMMOGRAM WITH CAD AND TOMO ULTRASOUND RIGHT BREAST COMPARISON:  Previous exam(s). ACR Breast Density Category b: There are scattered areas of fibroglandular density. FINDINGS: An oval, circumscribed equal density mass in the lower outer right breast at middle depth is mammographically stable. No new or suspicious findings are identified in either breast. The parenchymal pattern is stable. Mammographic images were processed with CAD. Targeted ultrasound is performed, showing stable appearance of an oval, circumscribed hypoechoic mass at the 8 o'clock position 6 cm from the nipple. It measures 1.9 x 1.7 x 0.6 cm (1.9 x 1.6 x 0.7 cm). Slight variations are likely due to differences in positioning and technique. IMPRESSION: 1. Benign right breast mass  demonstrating greater than 2 year stability. No further imaging follow-up required. 2. No mammographic evidence of malignancy in either breast. RECOMMENDATION: Screening mammogram in  one year.(Code:SM-B-01Y) I have discussed the findings and recommendations with the patient. Results were also provided in writing at the conclusion of the visit. If applicable, a reminder letter will be sent to the patient regarding the next appointment. BI-RADS CATEGORY  2: Benign. Electronically Signed   By: Sande Brothers M.D.   On: 12/13/2018 14:12    Pelvic/Bimanual Pap is not indicated today per BCCCP guidelines.   Smoking History: Patient has never smoked.   Patient Navigation: Patient education provided. Access to services provided for patient through [place] program. Spanish interpreter Natale Lay from [facility] provided.   Colorectal Cancer Screening: Per patient has never had colonoscopy completed. FIT Test given to patient to complete. No complaints today.    Breast and Cervical Cancer Risk Assessment: Patient does not have family history of breast cancer, known genetic mutations, or radiation treatment to the chest before age 86. Patient does not have history of cervical dysplasia, immunocompromised, or DES exposure in-utero.  Risk Scores as of Encounter on 11/25/2023     Katrina Richardson           5-year 0.27%   Lifetime 3.38%            Last calculated by Caprice Red, CMA on 11/25/2023 at  3:01 PM        A: BCCCP exam without pap smear No complaints.  P: Referred patient to the [facility] for a screening mammogram on mobile unit. Appointment scheduled Thursday, November 25, 2023 at 1530.  Priscille Heidelberg, RN 11/25/2023 3:14 PM

## 2023-12-14 NOTE — Progress Notes (Signed)
 I have mailed pt a FIT test for completion.
